# Patient Record
Sex: Female | Born: 1955 | Race: Black or African American | Hispanic: No | Marital: Married | Smoking: Never smoker
Health system: Southern US, Community
[De-identification: ages and names within clinical notes are randomized; demographics above are authoritative.]

## PROBLEM LIST (undated history)

## (undated) DIAGNOSIS — E785 Hyperlipidemia, unspecified: Secondary | ICD-10-CM

## (undated) DIAGNOSIS — J309 Allergic rhinitis, unspecified: Secondary | ICD-10-CM

## (undated) DIAGNOSIS — D219 Benign neoplasm of connective and other soft tissue, unspecified: Secondary | ICD-10-CM

## (undated) DIAGNOSIS — E079 Disorder of thyroid, unspecified: Secondary | ICD-10-CM

## (undated) DIAGNOSIS — N189 Chronic kidney disease, unspecified: Secondary | ICD-10-CM

## (undated) DIAGNOSIS — I10 Essential (primary) hypertension: Secondary | ICD-10-CM

## (undated) DIAGNOSIS — E039 Hypothyroidism, unspecified: Secondary | ICD-10-CM

## (undated) DIAGNOSIS — E559 Vitamin D deficiency, unspecified: Secondary | ICD-10-CM

## (undated) HISTORY — DX: Disorder of thyroid, unspecified: E07.9

## (undated) HISTORY — PX: LIPOMA EXCISION: SHX5283

## (undated) HISTORY — DX: Essential (primary) hypertension: I10

## (undated) HISTORY — PX: BREAST SURGERY: SHX581

## (undated) HISTORY — PX: OTHER SURGICAL HISTORY: SHX169

## (undated) HISTORY — PX: FINE NEEDLE ASPIRATION: SHX406

---

## 2002-04-04 HISTORY — PX: ABDOMINAL HYSTERECTOMY: SHX81

## 2004-02-05 ENCOUNTER — Ambulatory Visit: Payer: Self-pay | Admitting: Obstetrics and Gynecology

## 2004-04-26 ENCOUNTER — Ambulatory Visit: Payer: Self-pay

## 2005-05-03 ENCOUNTER — Ambulatory Visit: Payer: Self-pay | Admitting: Obstetrics and Gynecology

## 2005-10-05 ENCOUNTER — Emergency Department: Payer: Self-pay | Admitting: Emergency Medicine

## 2006-05-11 ENCOUNTER — Ambulatory Visit: Payer: Self-pay | Admitting: Obstetrics and Gynecology

## 2007-07-03 ENCOUNTER — Ambulatory Visit: Payer: Self-pay | Admitting: Obstetrics and Gynecology

## 2007-09-03 ENCOUNTER — Ambulatory Visit: Payer: Self-pay | Admitting: Gastroenterology

## 2008-07-03 ENCOUNTER — Ambulatory Visit: Payer: Self-pay | Admitting: Obstetrics and Gynecology

## 2009-07-07 ENCOUNTER — Ambulatory Visit: Payer: Self-pay | Admitting: Obstetrics and Gynecology

## 2010-08-05 ENCOUNTER — Ambulatory Visit: Payer: Self-pay | Admitting: Obstetrics and Gynecology

## 2011-04-05 HISTORY — PX: COLONOSCOPY: SHX174

## 2011-08-08 ENCOUNTER — Ambulatory Visit: Payer: Self-pay | Admitting: Obstetrics and Gynecology

## 2011-12-13 ENCOUNTER — Emergency Department: Payer: Self-pay | Admitting: Emergency Medicine

## 2012-06-01 ENCOUNTER — Ambulatory Visit: Payer: Self-pay | Admitting: Gastroenterology

## 2012-06-04 LAB — PATHOLOGY REPORT

## 2012-09-18 ENCOUNTER — Ambulatory Visit: Payer: Self-pay | Admitting: Obstetrics and Gynecology

## 2012-10-08 ENCOUNTER — Ambulatory Visit: Payer: Self-pay | Admitting: Obstetrics and Gynecology

## 2012-10-12 ENCOUNTER — Encounter: Payer: Self-pay | Admitting: *Deleted

## 2012-11-05 ENCOUNTER — Ambulatory Visit (INDEPENDENT_AMBULATORY_CARE_PROVIDER_SITE_OTHER): Payer: Managed Care, Other (non HMO) | Admitting: General Surgery

## 2012-11-05 ENCOUNTER — Other Ambulatory Visit: Payer: Self-pay

## 2012-11-05 ENCOUNTER — Encounter: Payer: Self-pay | Admitting: General Surgery

## 2012-11-05 VITALS — BP 118/68 | HR 76 | Resp 16 | Ht 66.0 in | Wt 282.0 lb

## 2012-11-05 DIAGNOSIS — R928 Other abnormal and inconclusive findings on diagnostic imaging of breast: Secondary | ICD-10-CM

## 2012-11-05 DIAGNOSIS — N63 Unspecified lump in unspecified breast: Secondary | ICD-10-CM

## 2012-11-05 NOTE — Progress Notes (Addendum)
Patient ID: Vanessa Gray, female   DOB: 1955/04/06, 57 y.o.   MRN: [id number]  Chief Complaint  Patient presents with  . Other    New Patient abdnormal mammogram    HPI Shelvy Heckert is a 57 y.o. female who presents for a breast evaluation. The most recent mammogram was done on 09/19/12 with a birad category 0. Additional views of the right breast and a right breast ultrasound was done on 10/08/12 @ [facility] with a category 4.  The patient does not perform self breast checks. She has a family history of breast cancer. She denies any previous breast surgeries. Last year she had some clear discharge from both breasts. She saw Dr. Logan Bores and  he decided to do slides which come back negative. No new problems with the breasts at this time.    HPI  Past Medical History  Diagnosis Date  . Thyroid disease   . Hypertension     Past Surgical History  Procedure Laterality Date  . Abdominal hysterectomy  2004  . Colonoscopy  2013    Family History  Problem Relation Age of Onset  . Cancer Mother 61    colon  . Cancer Maternal Aunt 70    cancer  . Cancer Maternal Aunt 60    breast  . Cancer Other     great maternal grandmother - breast    Social History History  Substance Use Topics  . Smoking status: Never Smoker   . Smokeless tobacco: Not on file  . Alcohol Use: No    No Known Allergies  Current Outpatient Prescriptions  Medication Sig Dispense Refill  . aspirin 81 MG tablet Take 81 mg by mouth daily.      Marland Kitchen atorvastatin (LIPITOR) 10 MG tablet Take 10 mg by mouth daily.      . Cyanocobalamin (VITAMIN B 12 PO) Take 500 Int'l Units by mouth daily.      . fish oil-omega-3 fatty acids 1000 MG capsule Take 1 g by mouth daily.      . hydrochlorothiazide (HYDRODIURIL) 25 MG tablet Take 25 mg by mouth daily.      Marland Kitchen levothyroxine (SYNTHROID, LEVOTHROID) 100 MCG tablet Take 100 mcg by mouth daily before breakfast.      . lisinopril (PRINIVIL,ZESTRIL) 20 MG tablet Take 20 mg by mouth  daily.      . potassium chloride SA (K-DUR,KLOR-CON) 20 MEQ tablet Take 20 mEq by mouth daily.      . Vitamin D, Ergocalciferol, (DRISDOL) 50000 UNITS CAPS capsule Take 50,000 Units by mouth every 7 (seven) days.       No current facility-administered medications for this visit.    Review of Systems Review of Systems  Blood pressure 118/68, pulse 76, resp. rate 16, height 5\' 6"  (1.676 m), weight 282 lb (127.914 kg).  Physical Exam Physical Exam  Constitutional: She is oriented to person, place, and time. She appears well-developed and well-nourished.  Neck: Trachea normal. No thyromegaly present.  Cardiovascular: Normal rate, regular rhythm and normal heart sounds.   No murmur heard. Pulmonary/Chest: Effort normal and breath sounds normal. Right breast exhibits no inverted nipple, no mass, no nipple discharge, no skin change and no tenderness. Left breast exhibits no inverted nipple, no mass, no nipple discharge, no skin change and no tenderness.  Right breast larger then left.   Lymphadenopathy:    She has no cervical adenopathy.    She has no axillary adenopathy.  Neurological: She is alert and oriented to person, place,  and time.  Skin: Skin is warm and dry.    Data Reviewed Screening mammogram dated 09/19/2012 suggested an asymmetric density in the right breast seen only on the CC view. Left breast is unremarkable. BI-RAD-0.  Focal spot compression views of the right breast dated 10/08/2012 showed findings primarily in the CC view. Partial effacement. Cluster of calcifications were associated in this area.  Sonographic evaluation of the breast showed an indeterminate nodule within the central portion of the breast with radiographic and sonographic characteristic which are of concern. BI-RAD-4. A second nodule appreciated in the 3:00 position was also listed as indeterminate. BI-RAD-4.  Ultrasound of the same date at the 3:00 position reported as 0.48 x 0.5 x 0.85 cm partially  lobulated area, and at the 4:00 position of 0.45 x 0.46 x 0.71 cm area.  Review of the mammograms from 2013-2014 showed increased prominence in the medial bright breast lesions. The density in the central portion of the breast is unlikely related to the lesion at 3 or 4:00 position based on its relatively superficial location on in comparison to the mammogram.  The right breast was evaluated and at the 3:00 position a 0.58 x 0.72 x 0.88 cm softly lobulated mass with edge affect and slight thickening at the inferior aspect was identified. No clear-cut acoustic shadowing identified. The patient was amenable to FNA sampling. A total of 10 cc of 0.5% Xylocaine with 0.25% Marcaine with 1-200,000 units of epinephrine was used through the procedure. The lesion at the 3:00 position was approached with a 22-gauge needle and near complete resolution obtained. Thin, can fluid was returned and slides times before prepared for cytology. No bleeding was noted.  Attention was turned to the 4-5:00 position of the breast. Both areas or 6 cm from the nipple. At the 4:00 position a 0.5 x 0.61 x 0.8 cm multilobulated area with some faint acoustic enhancement is noted. This was aspirated with a 22-gauge needle with complete resolution. Slides x4 were prepared for a similar volume of thin, tan fluid. The patient tolerated procedure well.    Assessment    Complex cyst right breast, resolved on aspiration.  Questionable density in the central portion of the breast.    Plan    The patient will be contacted when cytology reports are available. Assuming these are benign she'll be asked to return in 3 months for reevaluation. Core biopsy may be necessary in the future.  Will review her films with the attending radiologist regarding the central right breast lesion     The mammograms and ultrasound were reviewed with the attending radiologist this morning. Until density is unlikely related to the 2 lesions noted at the 3  and 4:00 position on ultrasound. It nearly completely effaces on compression views, and a 6 month followup exam is warranted.  Earline Mayotte 11/05/2012, 7:31 PM

## 2012-11-05 NOTE — Patient Instructions (Signed)
Follow up in 3 months

## 2012-11-09 LAB — FINE-NEEDLE ASPIRATION

## 2012-11-13 ENCOUNTER — Telehealth: Payer: Self-pay | Admitting: *Deleted

## 2012-11-13 NOTE — Telephone Encounter (Signed)
Notified patient as instructed, patient pleased. Discussed follow-up appointments for 6 months, patient agrees

## 2012-11-13 NOTE — Telephone Encounter (Signed)
Confirming she received her results and she had.

## 2012-11-13 NOTE — Telephone Encounter (Signed)
Message copied by Currie Paris on Tue Nov 13, 2012  4:39 PM ------      Message from: [place], [place]      Created: Mon Nov 12, 2012  9:31 PM       Marcia:Please notify the patient of the cytology report on both lesions aspirated last week are benign. The films have been reviewed with the radiologist.  Am going to arrange for a followup right mammogram in 6 months.            Michelle: Please arrange for a followup mammogram of the right breast with focal spot compression views in 6 months with an office visit to follow. Thank you                           ----- Message -----         From: [facility] Lab Results In Interface         Sent: 11/09/2012   5:44 AM           To: Earline Mayotte, MD                   ------

## 2012-11-13 NOTE — Telephone Encounter (Signed)
Message copied by Currie Paris on Tue Nov 13, 2012  9:06 AM ------      Message from: [place], [place]      Created: Mon Nov 12, 2012  9:31 PM       Marcia:Please notify the patient of the cytology report on both lesions aspirated last week are benign. The films have been reviewed with the radiologist.  Am going to arrange for a followup right mammogram in 6 months.            Michelle: Please arrange for a followup mammogram of the right breast with focal spot compression views in 6 months with an office visit to follow. Thank you                           ----- Message -----         From: [facility] Lab Results In Interface         Sent: 11/09/2012   5:44 AM           To: Earline Mayotte, MD                   ------

## 2013-02-06 ENCOUNTER — Ambulatory Visit: Payer: Managed Care, Other (non HMO) | Admitting: General Surgery

## 2013-04-12 ENCOUNTER — Ambulatory Visit: Payer: Self-pay | Admitting: General Surgery

## 2013-04-15 ENCOUNTER — Encounter: Payer: Self-pay | Admitting: General Surgery

## 2013-04-24 ENCOUNTER — Encounter: Payer: Self-pay | Admitting: General Surgery

## 2013-04-24 ENCOUNTER — Ambulatory Visit (INDEPENDENT_AMBULATORY_CARE_PROVIDER_SITE_OTHER): Payer: Managed Care, Other (non HMO) | Admitting: General Surgery

## 2013-04-24 VITALS — BP 132/66 | HR 66 | Resp 14 | Ht 65.5 in | Wt 280.0 lb

## 2013-04-24 DIAGNOSIS — Z1231 Encounter for screening mammogram for malignant neoplasm of breast: Secondary | ICD-10-CM

## 2013-04-24 NOTE — Progress Notes (Signed)
Patient ID: Vanessa Gray, female   DOB: November 11, 1955, 58 y.o.   MRN: [id number]  Chief Complaint  Patient presents with  . Follow-up    mammogram    HPI Vanessa Gray is a 58 y.o. female who presents for a breast evaluation. The most recent mammogram was done on 04/12/13.  Patient does perform regular self breast checks and gets regular mammograms done.  No new breast issues.  HPI  Past Medical History  Diagnosis Date  . Thyroid disease   . Hypertension     Past Surgical History  Procedure Laterality Date  . Abdominal hysterectomy  2004  . Colonoscopy  2013  . Breast biopsy Right 11-05-12    FNA /SCANT CELLULARITY/.APOCRINE METAPLASIA    Family History  Problem Relation Age of Onset  . Cancer Mother 47    colon  . Cancer Maternal Aunt 70    cancer  . Cancer Maternal Aunt 60    breast  . Cancer Other     great maternal grandmother - breast    Social History History  Substance Use Topics  . Smoking status: Never Smoker   . Smokeless tobacco: Never Used  . Alcohol Use: No    No Known Allergies  Current Outpatient Prescriptions  Medication Sig Dispense Refill  . aspirin 81 MG tablet Take 81 mg by mouth daily.      Marland Kitchen atorvastatin (LIPITOR) 10 MG tablet Take 10 mg by mouth daily.      . Cyanocobalamin (VITAMIN B 12 PO) Take 500 Int'l Units by mouth daily.      . fish oil-omega-3 fatty acids 1000 MG capsule Take 1 g by mouth daily.      . hydrochlorothiazide (HYDRODIURIL) 25 MG tablet Take 25 mg by mouth daily.      Marland Kitchen levothyroxine (SYNTHROID, LEVOTHROID) 100 MCG tablet Take 100 mcg by mouth daily before breakfast.      . lisinopril (PRINIVIL,ZESTRIL) 20 MG tablet Take 20 mg by mouth daily.      . potassium chloride SA (K-DUR,KLOR-CON) 20 MEQ tablet Take 20 mEq by mouth daily.      . Vitamin D, Ergocalciferol, (DRISDOL) 50000 UNITS CAPS capsule Take 50,000 Units by mouth every 7 (seven) days.       No current facility-administered medications for this visit.     Review of Systems Review of Systems  Constitutional: Negative.   Respiratory: Negative.   Cardiovascular: Negative.     Blood pressure 132/66, pulse 66, resp. rate 14, height 5' 5.5" (1.664 m), weight 280 lb (127.007 kg).  Physical Exam Physical Exam  Constitutional: She is oriented to person, place, and time. She appears well-developed and well-nourished.  Neck: Neck supple.  Cardiovascular: Normal rate, regular rhythm and normal heart sounds.   Pulmonary/Chest: Effort normal and breath sounds normal. Right breast exhibits no inverted nipple, no mass, no nipple discharge, no skin change and no tenderness. Left breast exhibits no inverted nipple, no mass, no nipple discharge, no skin change and no tenderness.  Lymphadenopathy:    She has no cervical adenopathy.    She has no axillary adenopathy.  Neurological: She is alert and oriented to person, place, and time.  Skin: Skin is warm and dry.    Data Reviewed Right breast mammogram dated 04/12/2013 showed resolution of the previous identified nodular density in the central portion of the breast. BI-RAD-1.  Assessment    Benign breast exam to    Plan    The patient should resume annual screening  mammograms under the care of her primary care provider in summer 2015.       Robert Bellow 04/25/2013, 5:44 PM

## 2013-04-24 NOTE — Patient Instructions (Addendum)
Continue self breast exams. Call office for any new breast issues or concerns. 

## 2013-04-25 DIAGNOSIS — Z1231 Encounter for screening mammogram for malignant neoplasm of breast: Secondary | ICD-10-CM | POA: Insufficient documentation

## 2013-09-23 ENCOUNTER — Ambulatory Visit: Payer: Self-pay | Admitting: Obstetrics and Gynecology

## 2014-01-11 IMAGING — MG MM ADDITIONAL VIEWS AT NO CHARGE
2 series · 7 of 7 positions shown · non-contrast
Comparison: none

REASON FOR EXAM: av rt asymmetric density
COMMENTS:

PROCEDURE:     MAM - MAM DIG ADDVIEWS RT SCR  - October 08, 2012  [DATE]
RESULT:

[R ML · right · 4 of 4 slices shown]
[im 1/4]
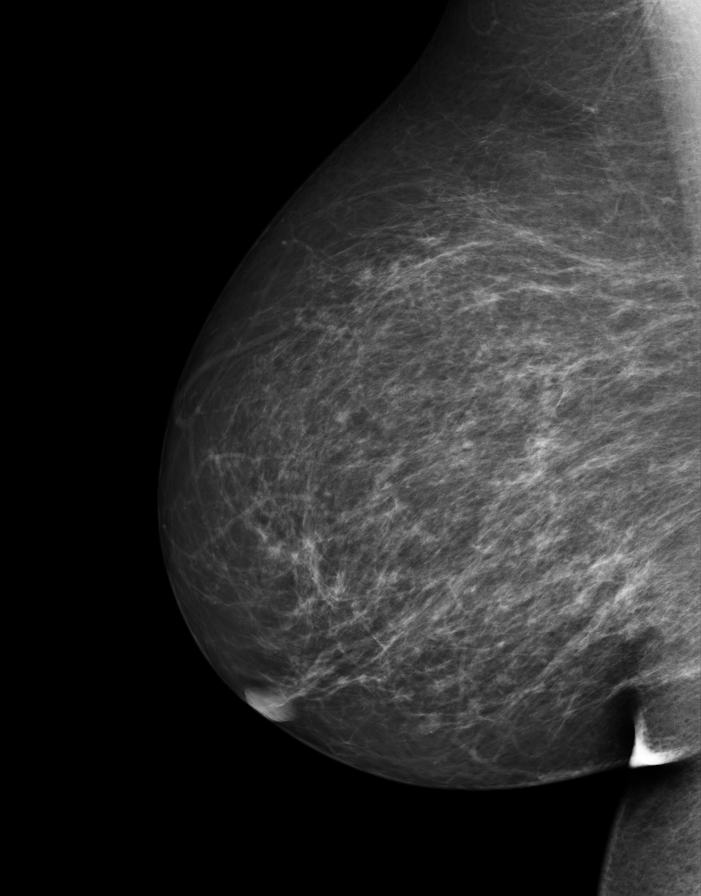
[im 2/4]
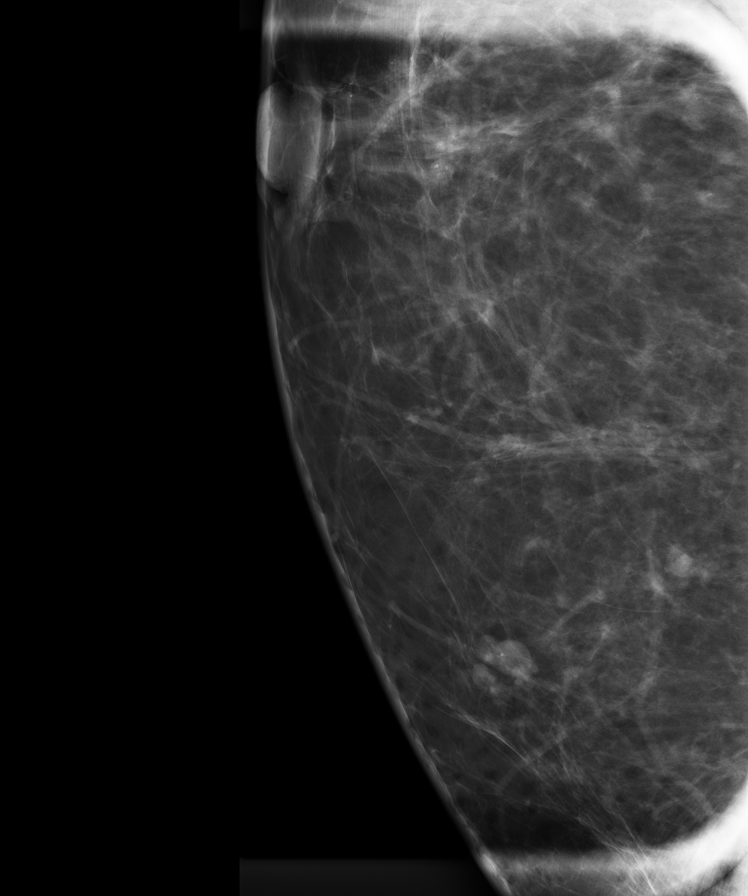
[im 3/4]
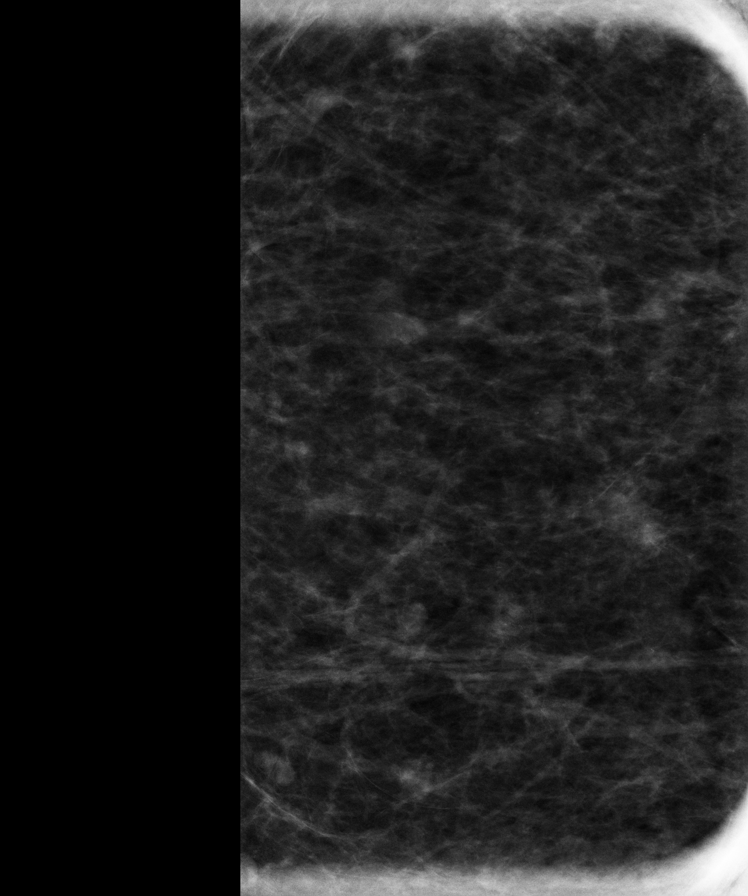
[im 4/4]
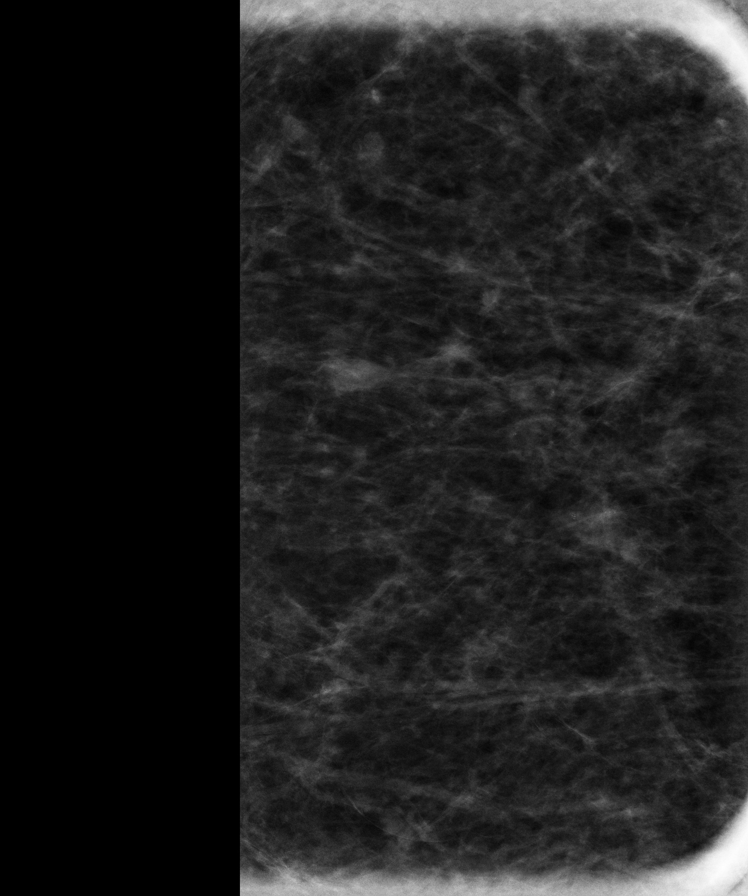

[R CC · right · 3 of 3 slices shown]
[im 1/3]
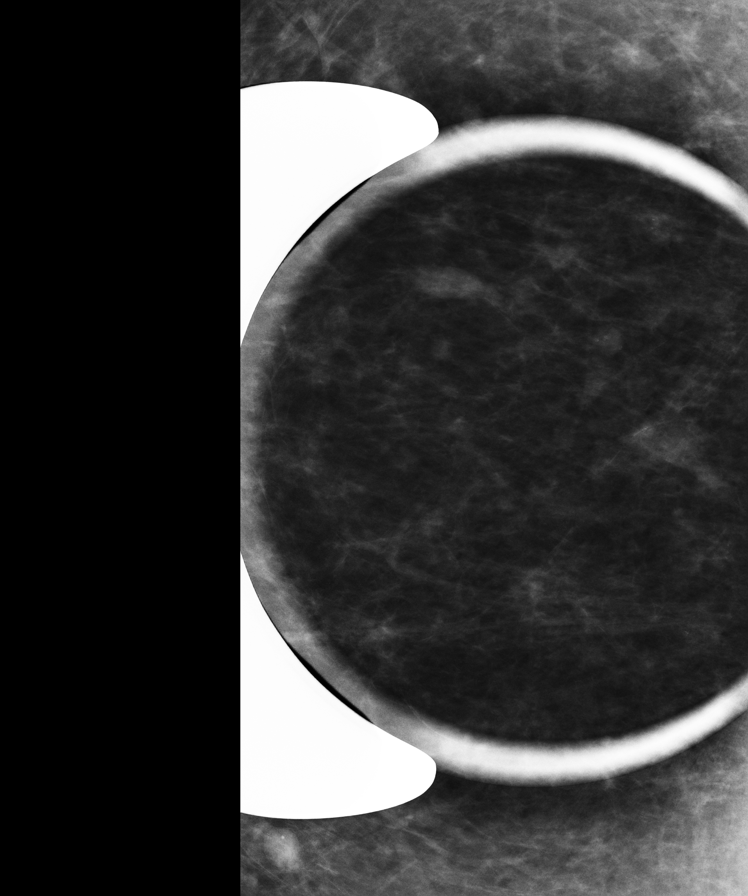
[im 2/3]
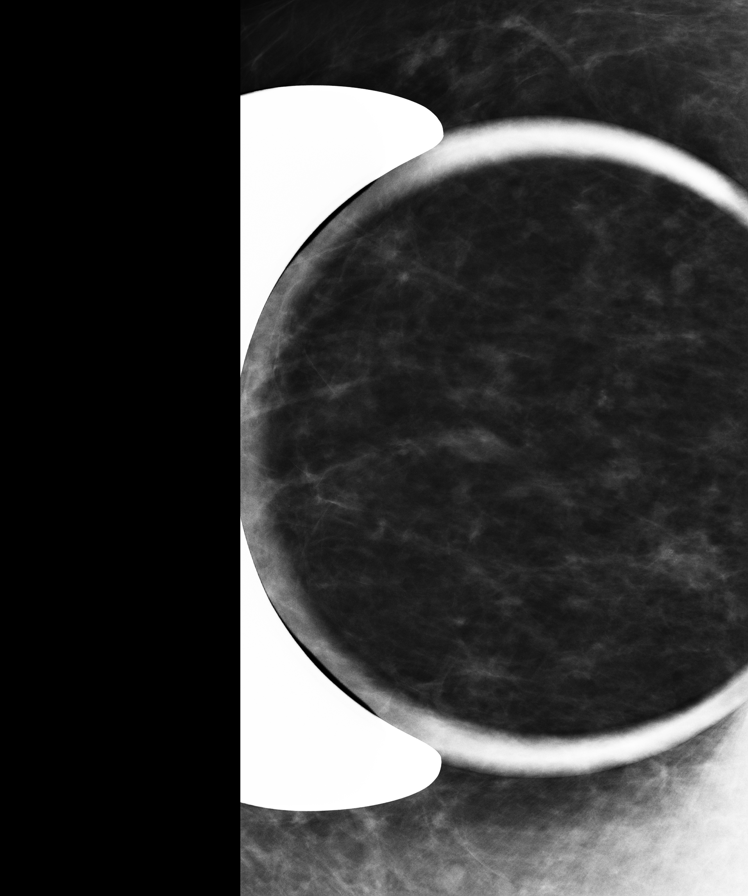
[im 3/3]
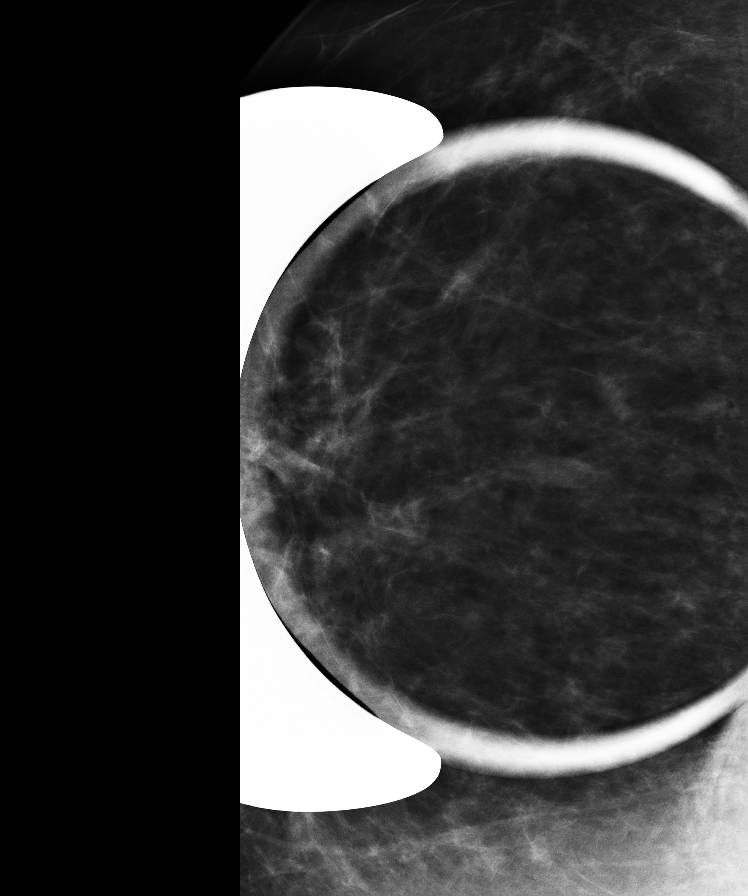

[7 of 7 positions shown; findings below may reference images not displayed]

FINDINGS: The area centrally within the right breast was further evaluated
with magnification compression imaging. This finding is appreciated
primarily on the CC view and persists with compression. There is a component
of mild effacement. Small foci of tightly clustered calcifications are
associated with this finding. Sonographic evaluation of this region
demonstrates an irregularly bordered hypoechoic nodule without vascularity.
There does appear to be a component of acoustic shadowing. The radiographic
and sonographic findings are indeterminate to concerning. The second nodule
of interest more medially within the right breast also persists with
compression. This area demonstrates borders which are primarily smoothly
marginated though partially obscured. This area appears to correlate with
the finding at the 3 o'clock position on ultrasound with hypoechoic to
anechoic architecture and smoothly marginated borders. Punctate areas of
benign-appearing calcifications are identified within this nodule on the
radiographic and sonographic findings. A smaller adjacent nodular focus is
also appreciated in this region with similar characteristics.
IMPRESSION: 1. Indeterminate nodule within the central portion of the breast with
radiographic and sonographic characteristics, which are concerning. Surgical
consultation recommended.

BI-RADS: Category 4 - Suspicious Abnormality.

2. A second nodule in a more lateral position at approximately the 3 o'clock
position demonstrating sonographic and radiographic characteristics, which
are indeterminate and surgical consultation recommended.

BI-RADS: Category 4 - Suspicious Abnormality.

A NEGATIVE MAMMOGRAM REPORT DOES NOT PRECLUDE BIOPSY OR OTHER EVALUATION OF
A CLINICALLY PALPABLE OR OTHERWISE SUSPICIOUS MASS OR LESION. BREAST CANCER
MAY NOT BE DETECTED BY MAMMOGRAPHY IN UP TO 10% OF CASES.

## 2014-01-11 IMAGING — US ULTRASOUND RIGHT BREAST
1 series · 14 of 25 positions shown · non-contrast
Comparison: none

REASON FOR EXAM: av rt asymmetric density
COMMENTS:

PROCEDURE:     US  - US BREAST RIGHT  - October 08, 2012 [DATE]
RESULT:

[Series 1: ultrasound right breast · 0.14mm/px · 14 of 30 slices shown]
[im 1/30]
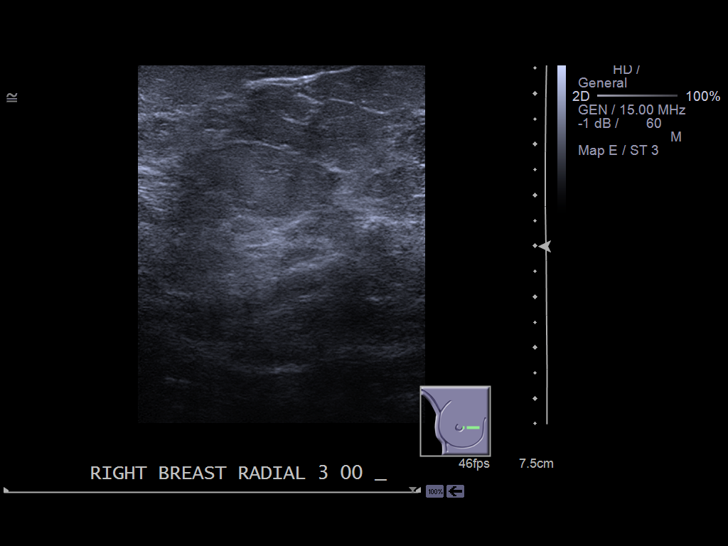
[im 3/30]
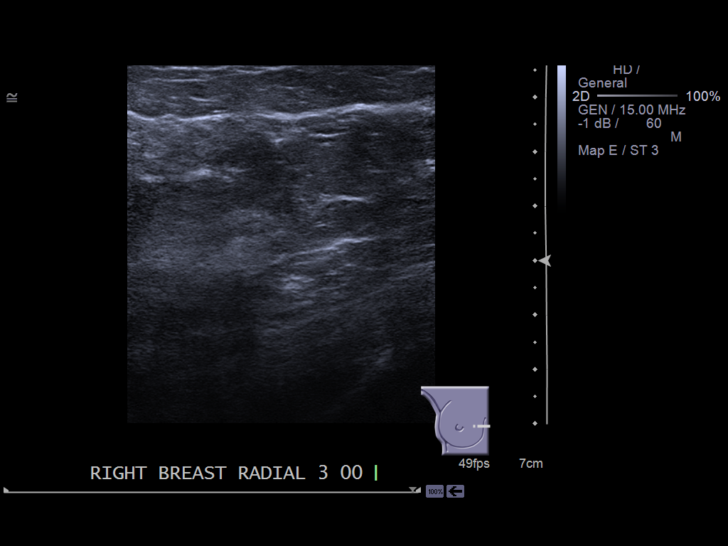
[im 5/30]
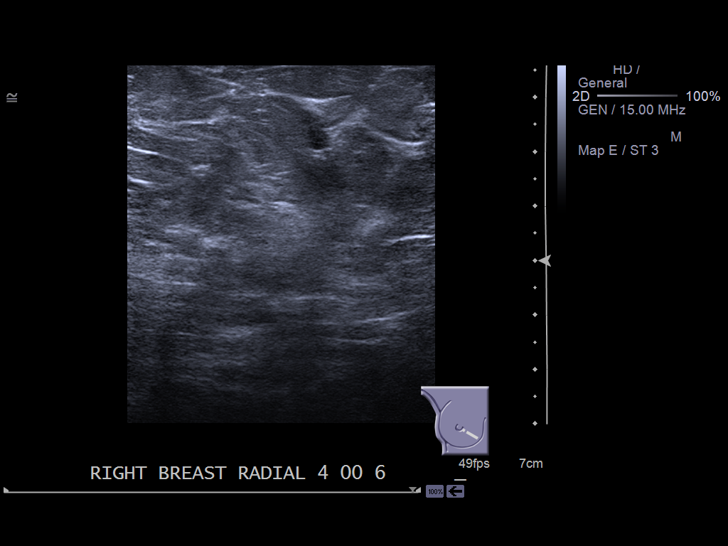
[im 8/30]
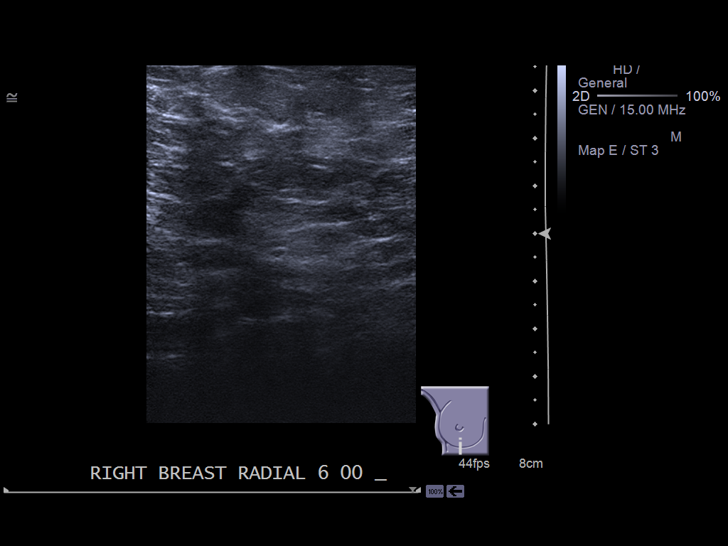
[im 10/30]
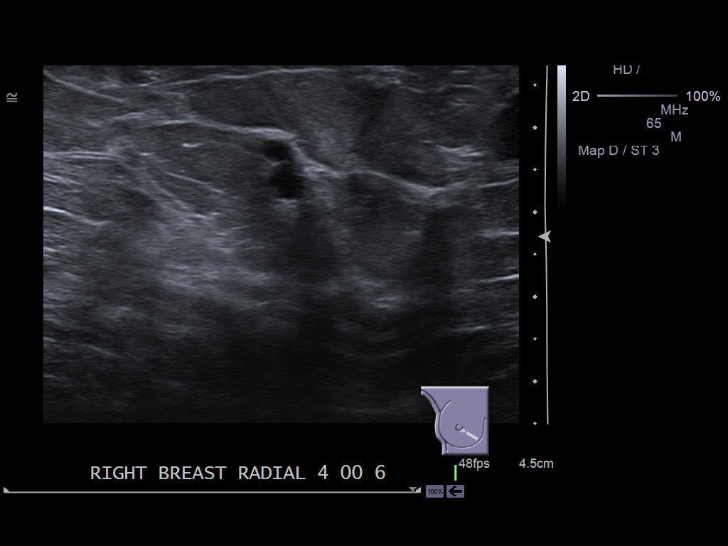
[im 11/30]
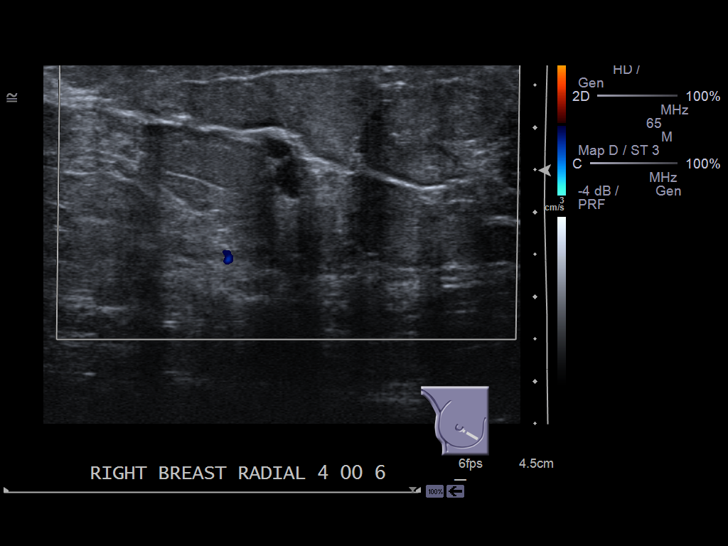
[im 14/30]
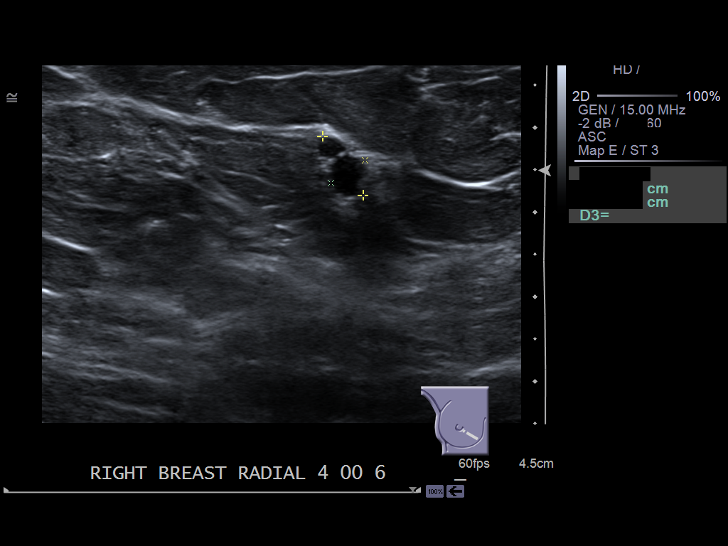
[im 16/30]
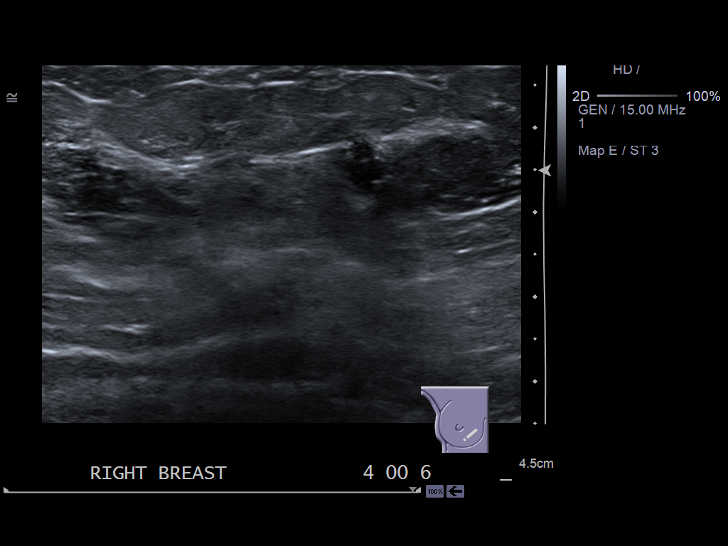
[im 19/30]
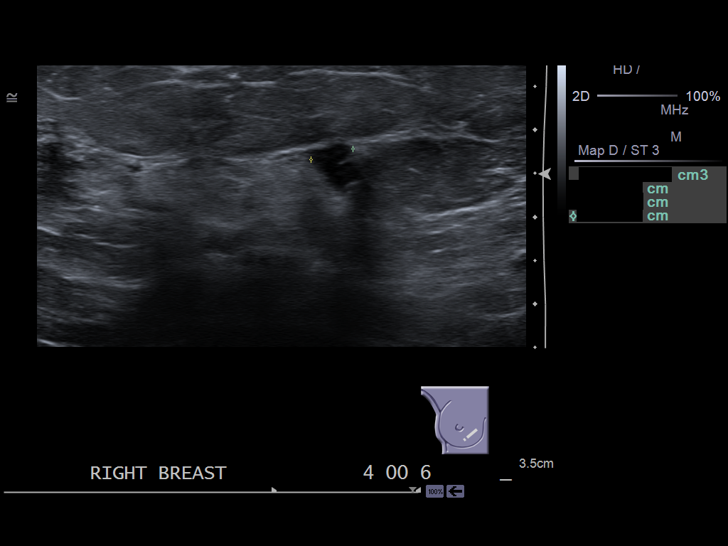
[im 20/30]
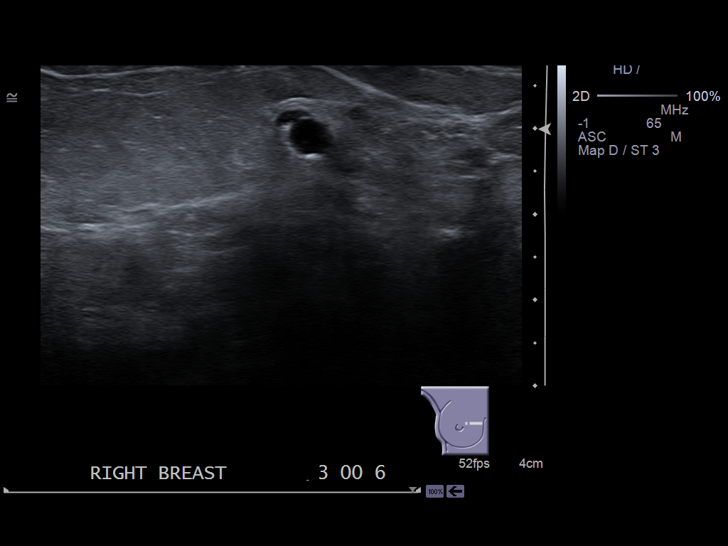
[im 22/30]
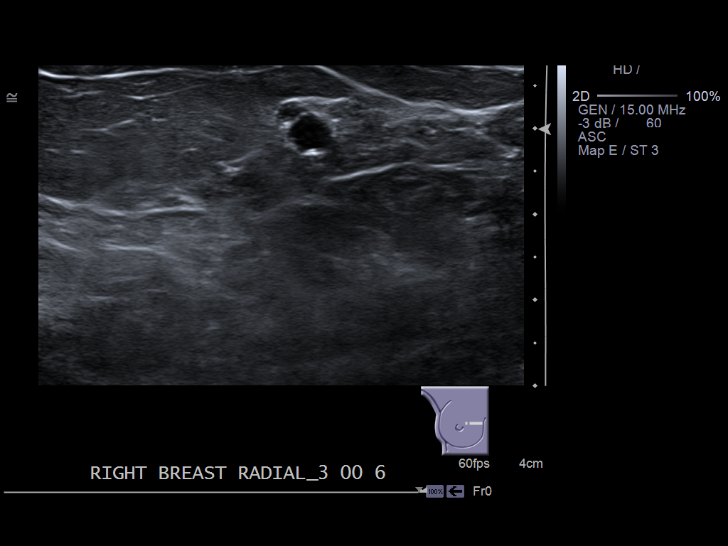
[im 25/30]
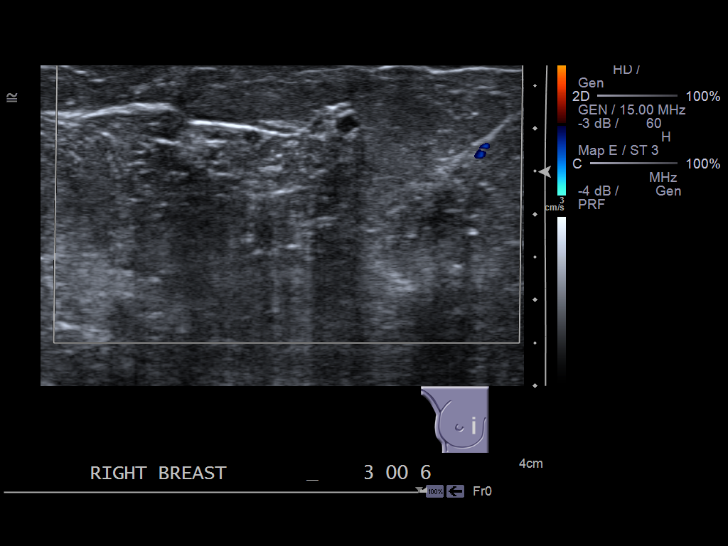
[im 27/30]
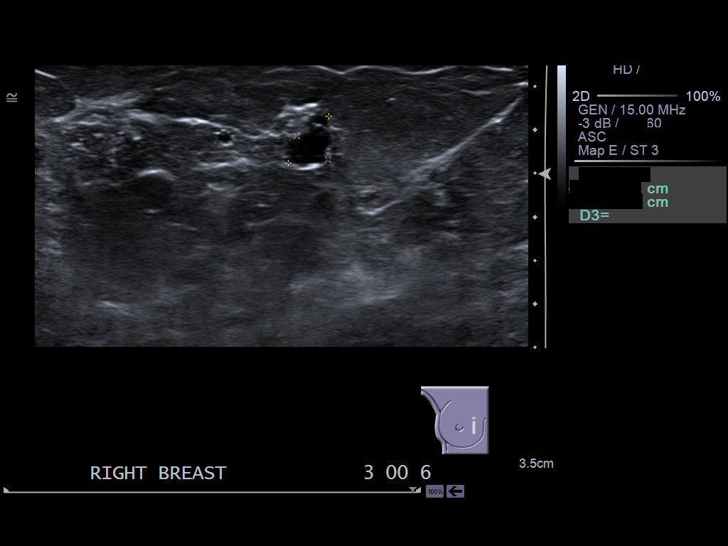
[im 30/30]
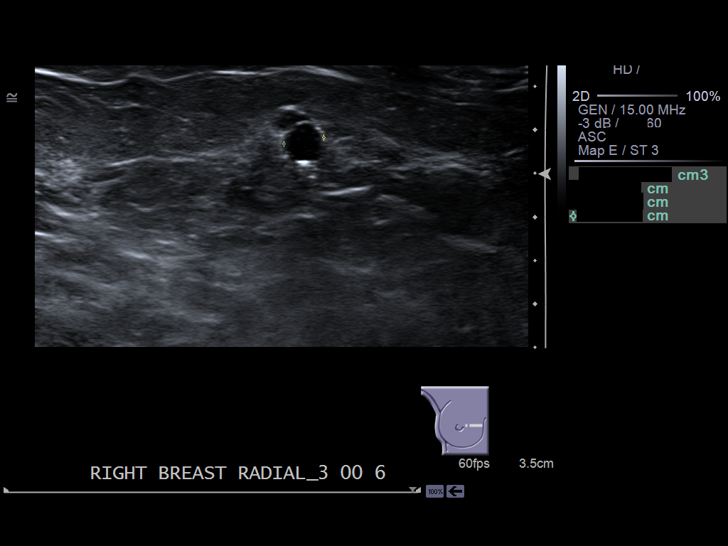

[14 of 25 positions shown; findings below may reference images not displayed]

FINDINGS: Evaluation of the right breast demonstrates an oblong shaped
hypoechoic nodule at the 3 o'clock position approximately 6 cm from the
nipple. A punctate area of calcification is identified within the nodule.
The borders are partially lobulated. This area measures 0.85 x 0.48 x
cm. The sonographic characteristics are indeterminate and further evaluation
with surgical consultation is recommended. A second area is identified at
the 4 o'clock position, which demonstrates a hypoechoic to anechoic
architecture and more rounded smoothly marginated borders. This has a
multifocal appearance and measures 0.71 x 0.45 x 0.46 cm. There is a
component of echogenicity associated with this finding. This finding too is
indeterminate and surgical consultation recommended. Note, these findings
are avascular.
IMPRESSION: 1. Indeterminate findings at the 3 o'clock and 4 o'clock positions of the
breast as described above. Surgical consultation recommended.

## 2014-02-03 ENCOUNTER — Encounter: Payer: Self-pay | Admitting: General Surgery

## 2014-09-18 ENCOUNTER — Other Ambulatory Visit: Payer: Self-pay | Admitting: Obstetrics and Gynecology

## 2014-09-18 DIAGNOSIS — Z1231 Encounter for screening mammogram for malignant neoplasm of breast: Secondary | ICD-10-CM

## 2014-09-25 ENCOUNTER — Ambulatory Visit
Admission: RE | Admit: 2014-09-25 | Discharge: 2014-09-25 | Disposition: A | Discharge status: Home / Self Care | Payer: 59 | Source: Ambulatory Visit | Attending: Obstetrics and Gynecology | Admitting: Obstetrics and Gynecology

## 2014-09-25 DIAGNOSIS — Z1231 Encounter for screening mammogram for malignant neoplasm of breast: Secondary | ICD-10-CM | POA: Diagnosis not present

## 2015-10-15 ENCOUNTER — Other Ambulatory Visit: Payer: Self-pay | Admitting: Obstetrics and Gynecology

## 2015-10-15 DIAGNOSIS — Z1231 Encounter for screening mammogram for malignant neoplasm of breast: Secondary | ICD-10-CM

## 2015-11-03 ENCOUNTER — Ambulatory Visit
Admission: RE | Admit: 2015-11-03 | Discharge: 2015-11-03 | Disposition: A | Discharge status: Home / Self Care | Payer: 59 | Source: Ambulatory Visit | Attending: Obstetrics and Gynecology | Admitting: Obstetrics and Gynecology

## 2015-11-03 DIAGNOSIS — Z1231 Encounter for screening mammogram for malignant neoplasm of breast: Secondary | ICD-10-CM

## 2015-11-06 ENCOUNTER — Other Ambulatory Visit: Payer: Self-pay | Admitting: Obstetrics and Gynecology

## 2015-11-06 DIAGNOSIS — N6310 Unspecified lump in the right breast, unspecified quadrant: Secondary | ICD-10-CM

## 2015-11-12 ENCOUNTER — Ambulatory Visit
Admission: RE | Admit: 2015-11-12 | Discharge: 2015-11-12 | Disposition: A | Discharge status: Home / Self Care | Payer: 59 | Source: Ambulatory Visit | Attending: Obstetrics and Gynecology | Admitting: Obstetrics and Gynecology

## 2015-11-12 DIAGNOSIS — N63 Unspecified lump in breast: Secondary | ICD-10-CM | POA: Diagnosis present

## 2015-11-12 DIAGNOSIS — N6310 Unspecified lump in the right breast, unspecified quadrant: Secondary | ICD-10-CM

## 2015-11-12 DIAGNOSIS — N6001 Solitary cyst of right breast: Secondary | ICD-10-CM | POA: Insufficient documentation

## 2016-10-20 ENCOUNTER — Other Ambulatory Visit: Payer: Self-pay | Admitting: Internal Medicine

## 2016-10-20 DIAGNOSIS — Z1231 Encounter for screening mammogram for malignant neoplasm of breast: Secondary | ICD-10-CM

## 2016-11-08 ENCOUNTER — Ambulatory Visit
Admission: RE | Admit: 2016-11-08 | Discharge: 2016-11-08 | Disposition: A | Discharge status: Home / Self Care | Payer: Commercial Managed Care - PPO | Source: Ambulatory Visit | Attending: Internal Medicine | Admitting: Internal Medicine

## 2016-11-08 DIAGNOSIS — Z1231 Encounter for screening mammogram for malignant neoplasm of breast: Secondary | ICD-10-CM | POA: Insufficient documentation

## 2017-11-01 ENCOUNTER — Other Ambulatory Visit: Payer: Self-pay | Admitting: Internal Medicine

## 2017-11-01 DIAGNOSIS — Z1231 Encounter for screening mammogram for malignant neoplasm of breast: Secondary | ICD-10-CM

## 2017-11-28 ENCOUNTER — Ambulatory Visit
Admission: RE | Admit: 2017-11-28 | Discharge: 2017-11-28 | Disposition: A | Discharge status: Home / Self Care | Payer: Commercial Managed Care - PPO | Source: Ambulatory Visit | Attending: Internal Medicine | Admitting: Internal Medicine

## 2017-11-28 DIAGNOSIS — Z1231 Encounter for screening mammogram for malignant neoplasm of breast: Secondary | ICD-10-CM | POA: Insufficient documentation

## 2018-06-07 ENCOUNTER — Encounter: Payer: Self-pay | Admitting: Emergency Medicine

## 2018-06-08 ENCOUNTER — Ambulatory Visit: Payer: Commercial Managed Care - PPO | Admitting: Certified Registered"

## 2018-06-08 ENCOUNTER — Encounter: Payer: Self-pay | Admitting: *Deleted

## 2018-06-08 ENCOUNTER — Encounter
Admission: RE | Disposition: A | Discharge status: Home / Self Care | Payer: Self-pay | Source: Home / Self Care | Attending: Gastroenterology

## 2018-06-08 ENCOUNTER — Ambulatory Visit
Admission: RE | Admit: 2018-06-08 | Discharge: 2018-06-08 | Disposition: A | Discharge status: Home / Self Care | Payer: Commercial Managed Care - PPO | Attending: Gastroenterology | Admitting: Gastroenterology

## 2018-06-08 DIAGNOSIS — J45909 Unspecified asthma, uncomplicated: Secondary | ICD-10-CM | POA: Diagnosis not present

## 2018-06-08 DIAGNOSIS — Z6841 Body Mass Index (BMI) 40.0 and over, adult: Secondary | ICD-10-CM | POA: Diagnosis not present

## 2018-06-08 DIAGNOSIS — Z1211 Encounter for screening for malignant neoplasm of colon: Secondary | ICD-10-CM | POA: Diagnosis present

## 2018-06-08 DIAGNOSIS — D123 Benign neoplasm of transverse colon: Secondary | ICD-10-CM | POA: Insufficient documentation

## 2018-06-08 DIAGNOSIS — Z7982 Long term (current) use of aspirin: Secondary | ICD-10-CM | POA: Diagnosis not present

## 2018-06-08 DIAGNOSIS — Z86010 Personal history of colonic polyps: Secondary | ICD-10-CM | POA: Diagnosis not present

## 2018-06-08 DIAGNOSIS — E559 Vitamin D deficiency, unspecified: Secondary | ICD-10-CM | POA: Diagnosis not present

## 2018-06-08 DIAGNOSIS — D175 Benign lipomatous neoplasm of intra-abdominal organs: Secondary | ICD-10-CM | POA: Insufficient documentation

## 2018-06-08 DIAGNOSIS — R0683 Snoring: Secondary | ICD-10-CM | POA: Diagnosis not present

## 2018-06-08 DIAGNOSIS — Z79899 Other long term (current) drug therapy: Secondary | ICD-10-CM | POA: Diagnosis not present

## 2018-06-08 DIAGNOSIS — I10 Essential (primary) hypertension: Secondary | ICD-10-CM | POA: Insufficient documentation

## 2018-06-08 DIAGNOSIS — E039 Hypothyroidism, unspecified: Secondary | ICD-10-CM | POA: Insufficient documentation

## 2018-06-08 HISTORY — PX: COLONOSCOPY WITH PROPOFOL: SHX5780

## 2018-06-08 HISTORY — DX: Allergic rhinitis, unspecified: J30.9

## 2018-06-08 HISTORY — DX: Hypothyroidism, unspecified: E03.9

## 2018-06-08 HISTORY — DX: Vitamin D deficiency, unspecified: E55.9

## 2018-06-08 HISTORY — DX: Benign neoplasm of connective and other soft tissue, unspecified: D21.9

## 2018-06-08 SURGERY — COLONOSCOPY WITH PROPOFOL
Anesthesia: General

## 2018-06-08 MED ORDER — PROPOFOL 500 MG/50ML IV EMUL
INTRAVENOUS | Status: AC
  Filled 2018-06-08: qty 50

## 2018-06-08 MED ORDER — PROPOFOL 10 MG/ML IV BOLUS
INTRAVENOUS | Status: AC
  Filled 2018-06-08: qty 20

## 2018-06-08 MED ORDER — PROPOFOL 500 MG/50ML IV EMUL
INTRAVENOUS | Status: DC | PRN
  Administered 2018-06-08: 150 ug/kg/min via INTRAVENOUS

## 2018-06-08 MED ORDER — SODIUM CHLORIDE 0.9 % IV SOLN: INTRAVENOUS | Status: DC

## 2018-06-08 MED ORDER — PROPOFOL 10 MG/ML IV BOLUS
INTRAVENOUS | Status: DC | PRN
  Administered 2018-06-08: 50 mg via INTRAVENOUS

## 2018-06-08 MED ORDER — SODIUM CHLORIDE 0.9 % IV SOLN
INTRAVENOUS | Status: DC
  Administered 2018-06-08: 1000 mL via INTRAVENOUS

## 2018-06-08 MED ORDER — SODIUM CHLORIDE 0.9 % IV SOLN
INTRAVENOUS | Status: DC
  Administered 2018-06-08: 15:00:00 via INTRAVENOUS

## 2018-06-08 NOTE — Anesthesia Postprocedure Evaluation (Signed)
Anesthesia Post Note  Patient: Vanessa Gray  Procedure(s) Performed: COLONOSCOPY WITH PROPOFOL (N/A )  Patient location during evaluation: Endoscopy Anesthesia Type: General Level of consciousness: awake and alert Pain management: pain level controlled Vital Signs Assessment: post-procedure vital signs reviewed and stable Respiratory status: spontaneous breathing, nonlabored ventilation, respiratory function stable and patient connected to nasal cannula oxygen Cardiovascular status: blood pressure returned to baseline and stable Postop Assessment: no apparent nausea or vomiting Anesthetic complications: no     Last Vitals:  Vitals:   06/08/18 1540 06/08/18 1550  BP: 115/63 127/63  Pulse: 76 66  Resp: 19 16  Temp:    SpO2: 98% 100%    Last Pain:  Vitals:   06/08/18 1550  TempSrc:   PainSc: 0-No pain                 Martha Clan

## 2018-06-08 NOTE — Anesthesia Preprocedure Evaluation (Addendum)
Anesthesia Evaluation  Patient identified by MRN, date of birth, ID band Patient awake    Reviewed: Allergy & Precautions, H&P , NPO status , Patient's Chart, lab work & pertinent test results  Airway Mallampati: III  TM Distance: >3 FB     Dental  (+) Teeth Intact   Pulmonary asthma , sleep apnea (suspected; snores at night although says this has improved since she lost a significant amount of weight) ,           Cardiovascular hypertension,      Neuro/Psych negative neurological ROS  negative psych ROS   GI/Hepatic negative GI ROS, Neg liver ROS,   Endo/Other  Hypothyroidism Morbid obesity  Renal/GU negative Renal ROS  negative genitourinary   Musculoskeletal   Abdominal   Peds  Hematology negative hematology ROS (+)   Anesthesia Other Findings Past Medical History: No date: Allergic rhinitis No date: Fibroid tumor     Comment:  Uterine No date: Hypertension No date: Hypothyroidism No date: Thyroid disease No date: Vitamin D deficiency  Past Surgical History: 2004: ABDOMINAL HYSTERECTOMY No date: BREAST SURGERY 2013: COLONOSCOPY  BMI    Body Mass Index:  40.97 kg/m      Reproductive/Obstetrics negative OB ROS                            Anesthesia Physical Anesthesia Plan  ASA: III  Anesthesia Plan: General   Post-op Pain Management:    Induction:   PONV Risk Score and Plan: Propofol infusion and TIVA  Airway Management Planned: Natural Airway and Simple Face Mask  Additional Equipment:   Intra-op Plan:   Post-operative Plan:   Informed Consent: I have reviewed the patients History and Physical, chart, labs and discussed the procedure including the risks, benefits and alternatives for the proposed anesthesia with the patient or authorized representative who has indicated his/her understanding and acceptance.     Dental Advisory Given  Plan Discussed with:  Anesthesiologist and CRNA  Anesthesia Plan Comments:        Anesthesia Quick Evaluation

## 2018-06-08 NOTE — Transfer of Care (Signed)
Immediate Anesthesia Transfer of Care Note  Patient: Vanessa Gray  Procedure(s) Performed: COLONOSCOPY WITH PROPOFOL (N/A )  Patient Location: Endoscopy Unit  Anesthesia Type:General  Level of Consciousness: awake, alert  and oriented  Airway & Oxygen Therapy: Patient Spontanous Breathing and Patient connected to nasal cannula oxygen  Post-op Assessment: Report given to RN and Post -op Vital signs reviewed and stable  Post vital signs: Reviewed and stable  Last Vitals:  Vitals Value Taken Time  BP    Temp    Pulse    Resp    SpO2      Last Pain:  Vitals:   06/08/18 1259  TempSrc: Tympanic  PainSc: 0-No pain      Patients Stated Pain Goal: 0 (66/06/30 1601)  Complications: No apparent anesthesia complications

## 2018-06-08 NOTE — H&P (Signed)
Outpatient short stay form Pre-procedure 06/08/2018 2:44 PM Lollie Sails MD   Primary Physician: Dr Tracie Harrier  Reason for visit: Colonoscopy  History of present illness: Patient is a 63 year old female presenting today for colonoscopy in regards to her personal history of adenomatous colon polyps.  She also has a primary relative, mother, who has a personal history of colon cancer.  Patient's last colonoscopy was 06/01/2012.  She tolerated her prep well.  She takes no aspirin or blood thinning agent with the exception of a 81 mg aspirin that is been held.    Current Facility-Administered Medications:  .  0.9 %  sodium chloride infusion, , Intravenous, Continuous, Loistine Simas U, MD .  0.9 %  sodium chloride infusion, , Intravenous, Continuous, Lollie Sails, MD .  0.9 %  sodium chloride infusion, , Intravenous, Continuous, Lollie Sails, MD, Last Rate: 20 mL/hr at 06/08/18 1337, 1,000 mL at 06/08/18 1337  Medications Prior to Admission  Medication Sig Dispense Refill Last Dose  . aspirin 81 MG tablet Take 81 mg by mouth daily.   Past Week at Unknown time  . atorvastatin (LIPITOR) 10 MG tablet Take 10 mg by mouth daily.   Past Week at Unknown time  . Cyanocobalamin (VITAMIN B 12 PO) Take 500 Int'l Units by mouth daily.   Past Week at Unknown time  . fish oil-omega-3 fatty acids 1000 MG capsule Take 1 g by mouth daily.   Past Month at Unknown time  . hydrochlorothiazide (HYDRODIURIL) 25 MG tablet Take 25 mg by mouth daily.   06/07/2018 at Unknown time  . levothyroxine (SYNTHROID, LEVOTHROID) 100 MCG tablet Take 100 mcg by mouth daily before breakfast.   06/07/2018 at Unknown time  . lisinopril (PRINIVIL,ZESTRIL) 20 MG tablet Take 20 mg by mouth daily.   06/08/2018 at Unknown time  . potassium chloride SA (K-DUR,KLOR-CON) 20 MEQ tablet Take 20 mEq by mouth daily.   06/07/2018 at Unknown time  . Vitamin D, Ergocalciferol, (DRISDOL) 50000 UNITS CAPS capsule Take 50,000 Units by  mouth every 7 (seven) days.   Past Week at Unknown time  . Albuterol Sulfate 108 (90 Base) MCG/ACT AEPB Inhale into the lungs.   Not Taking at Unknown time     No Known Allergies   Past Medical History:  Diagnosis Date  . Allergic rhinitis   . Fibroid tumor    Uterine  . Hypertension   . Hypothyroidism   . Thyroid disease   . Vitamin D deficiency     Review of systems:      Physical Exam    Heart and lungs: Regular rate and rhythm without rub or gallop, lungs are bilaterally clear.    HEENT: Normocephalic atraumatic eyes are anicteric    Other:    Pertinant exam for procedure: Soft nontender nondistended bowel sounds positive normoactive.    Planned proceedures: Anoscopy and indicated procedures. I have discussed the risks benefits and complications of procedures to include not limited to bleeding, infection, perforation and the risk of sedation and the patient wishes to proceed.    Lollie Sails, MD Gastroenterology 06/08/2018  2:44 PM

## 2018-06-08 NOTE — Anesthesia Post-op Follow-up Note (Signed)
Anesthesia QCDR form completed.        

## 2018-06-08 NOTE — Op Note (Signed)
[facility] Gastroenterology Patient Name: Vanessa Gray Procedure Date: 06/08/2018 2:29 PM MRN: [id number] Account #: [id number] Date of Birth: Dec 27, 1955 Admit Type: Outpatient Age: 63 Room: [facility] ENDO ROOM 3 Gender: Female Note Status: Finalized Procedure:            Colonoscopy Indications:          Personal history of colonic polyps Providers:            Lollie Sails, MD Medicines:            Monitored Anesthesia Care Complications:        No immediate complications. Procedure:            Pre-Anesthesia Assessment:                       - ASA Grade Assessment: III - A patient with severe                        systemic disease.                       After obtaining informed consent, the colonoscope was                        passed under direct vision. Throughout the procedure,                        the patient's blood pressure, pulse, and oxygen                        saturations were monitored continuously. The                        Colonoscope was introduced through the anus and                        advanced to the the cecum, identified by appendiceal                        orifice and ileocecal valve. The colonoscopy was                        performed with moderate difficulty due to a tortuous                        colon. Successful completion of the procedure was aided                        by changing the patient to a prone position. The                        patient tolerated the procedure well. The quality of                        the bowel preparation was good. Findings:      A 5 mm polyp was found in the hepatic flexure. The polyp was sessile.       The polyp was removed with a cold snare. Resection and retrieval were       complete.      The retroflexed view of the distal rectum and anal verge  was normal and       showed no anal or rectal abnormalities.      The exam was otherwise without abnormality.      The digital rectal exam  was normal.      There was a small lipoma, positive pillow sign      in the ascending colon. Impression:           - One 5 mm polyp at the hepatic flexure, removed with a                        cold snare. Resected and retrieved.                       - The distal rectum and anal verge are normal on                        retroflexion view.                       - The examination was otherwise normal. Recommendation:       - Discharge patient to home.                       - Soft diet today, then advance as tolerated to advance                        diet as tolerated. Lollie Sails, MD 06/08/2018 3:29:34 PM This report has been signed electronically. Number of Addenda: 0 Note Initiated On: 06/08/2018 2:29 PM Scope Withdrawal Time: 0 hours 7 minutes 18 seconds  Total Procedure Duration: 0 hours 22 minutes 43 seconds       [facility]

## 2018-06-09 ENCOUNTER — Encounter: Payer: Self-pay | Admitting: Gastroenterology

## 2018-06-12 LAB — SURGICAL PATHOLOGY

## 2018-11-09 ENCOUNTER — Other Ambulatory Visit: Payer: Self-pay | Admitting: Obstetrics and Gynecology

## 2018-11-09 ENCOUNTER — Other Ambulatory Visit: Payer: Self-pay | Admitting: Internal Medicine

## 2018-12-25 ENCOUNTER — Other Ambulatory Visit: Payer: Self-pay | Admitting: Internal Medicine

## 2018-12-25 DIAGNOSIS — Z1231 Encounter for screening mammogram for malignant neoplasm of breast: Secondary | ICD-10-CM

## 2019-01-15 ENCOUNTER — Encounter (INDEPENDENT_AMBULATORY_CARE_PROVIDER_SITE_OTHER): Payer: Self-pay

## 2019-01-15 ENCOUNTER — Other Ambulatory Visit: Payer: Self-pay

## 2019-01-15 ENCOUNTER — Ambulatory Visit
Admission: RE | Admit: 2019-01-15 | Discharge: 2019-01-15 | Disposition: A | Discharge status: Home / Self Care | Payer: Commercial Managed Care - PPO | Source: Ambulatory Visit | Attending: Internal Medicine | Admitting: Internal Medicine

## 2019-01-15 DIAGNOSIS — Z1231 Encounter for screening mammogram for malignant neoplasm of breast: Secondary | ICD-10-CM | POA: Diagnosis present

## 2019-06-03 ENCOUNTER — Emergency Department: Payer: Medicare Other

## 2019-06-03 ENCOUNTER — Other Ambulatory Visit: Payer: Self-pay

## 2019-06-03 ENCOUNTER — Encounter: Payer: Self-pay | Admitting: Emergency Medicine

## 2019-06-03 ENCOUNTER — Emergency Department
Admission: EM | Admit: 2019-06-03 | Discharge: 2019-06-03 | Disposition: A | Discharge status: Home / Self Care | Payer: Medicare Other | Attending: Emergency Medicine | Admitting: Emergency Medicine

## 2019-06-03 DIAGNOSIS — E039 Hypothyroidism, unspecified: Secondary | ICD-10-CM | POA: Insufficient documentation

## 2019-06-03 DIAGNOSIS — R1032 Left lower quadrant pain: Secondary | ICD-10-CM | POA: Diagnosis present

## 2019-06-03 DIAGNOSIS — I10 Essential (primary) hypertension: Secondary | ICD-10-CM | POA: Diagnosis not present

## 2019-06-03 DIAGNOSIS — Z7982 Long term (current) use of aspirin: Secondary | ICD-10-CM | POA: Insufficient documentation

## 2019-06-03 DIAGNOSIS — Z79899 Other long term (current) drug therapy: Secondary | ICD-10-CM | POA: Diagnosis not present

## 2019-06-03 DIAGNOSIS — R1010 Upper abdominal pain, unspecified: Secondary | ICD-10-CM

## 2019-06-03 DIAGNOSIS — K529 Noninfective gastroenteritis and colitis, unspecified: Secondary | ICD-10-CM | POA: Diagnosis not present

## 2019-06-03 LAB — CBC
HCT: 38.4 % (ref 36.0–46.0)
Hemoglobin: 12.5 g/dL (ref 12.0–15.0)
MCH: 32.6 pg (ref 26.0–34.0)
MCHC: 32.6 g/dL (ref 30.0–36.0)
MCV: 100.3 fL — ABNORMAL HIGH (ref 80.0–100.0)
Platelets: 182 10*3/uL (ref 150–400)
RBC: 3.83 MIL/uL — ABNORMAL LOW (ref 3.87–5.11)
RDW: 12.9 % (ref 11.5–15.5)
WBC: 18.2 10*3/uL — ABNORMAL HIGH (ref 4.0–10.5)
nRBC: 0 % (ref 0.0–0.2)

## 2019-06-03 LAB — COMPREHENSIVE METABOLIC PANEL
ALT: 28 U/L (ref 0–44)
AST: 25 U/L (ref 15–41)
Albumin: 4.3 g/dL (ref 3.5–5.0)
Alkaline Phosphatase: 68 U/L (ref 38–126)
Anion gap: 13 (ref 5–15)
BUN: 19 mg/dL (ref 8–23)
CO2: 21 mmol/L — ABNORMAL LOW (ref 22–32)
Calcium: 9.3 mg/dL (ref 8.9–10.3)
Chloride: 108 mmol/L (ref 98–111)
Creatinine, Ser: 1.01 mg/dL — ABNORMAL HIGH (ref 0.44–1.00)
GFR calc Af Amer: >60 mL/min (ref >60)
GFR calc non Af Amer: 58 mL/min — ABNORMAL LOW (ref >60)
Glucose, Bld: 100 mg/dL — ABNORMAL HIGH (ref 70–99)
Potassium: 3.9 mmol/L (ref 3.5–5.1)
Sodium: 142 mmol/L (ref 135–145)
Total Bilirubin: 1 mg/dL (ref 0.3–1.2)
Total Protein: 7.7 g/dL (ref 6.5–8.1)

## 2019-06-03 LAB — URINALYSIS, COMPLETE (UACMP) WITH MICROSCOPIC
Bacteria, UA: NONE SEEN
Bilirubin Urine: NEGATIVE
Glucose, UA: NEGATIVE mg/dL
Hgb urine dipstick: NEGATIVE
Ketones, ur: 5 mg/dL — AB
Leukocytes,Ua: NEGATIVE
Nitrite: NEGATIVE
Protein, ur: NEGATIVE mg/dL
Specific Gravity, Urine: >1.046 — ABNORMAL HIGH (ref 1.005–1.030)
pH: 5 (ref 5.0–8.0)

## 2019-06-03 LAB — LIPASE, BLOOD: Lipase: 23 U/L (ref 11–51)

## 2019-06-03 MED ORDER — IOHEXOL 350 MG/ML SOLN
100.0000 mL | Freq: Once | INTRAVENOUS | Status: AC | PRN
  Administered 2019-06-03: 100 mL via INTRAVENOUS
  Filled 2019-06-03: qty 100

## 2019-06-03 MED ORDER — METRONIDAZOLE 500 MG PO TABS: 500.0000 mg | ORAL_TABLET | Freq: Two times a day (BID) | ORAL | 0 refills | Status: AC

## 2019-06-03 MED ORDER — CIPROFLOXACIN HCL 500 MG PO TABS: 500.0000 mg | ORAL_TABLET | Freq: Two times a day (BID) | ORAL | 0 refills | Status: AC

## 2019-06-03 MED ORDER — ONDANSETRON 4 MG PO TBDP: 4.0000 mg | ORAL_TABLET | Freq: Three times a day (TID) | ORAL | 0 refills | Status: AC | PRN

## 2019-06-03 NOTE — ED Triage Notes (Signed)
Patient presents to the ED with left lower and central lower abdominal pain that woke her up around 1:30am this morning.  Patient reports vomiting x 7 and diarrhea x 5 this morning.  Patient states now she is no longer nauseous but abdominal pain continues.  Patient states she recently began taking iron pills on Saturday.

## 2019-06-03 NOTE — ED Provider Notes (Signed)
[facility] Emergency Department Provider Note   ____________________________________________    I have reviewed the triage vital signs and the nursing notes.   HISTORY  Chief Complaint Diarrhea, Emesis, and Abdominal Pain     HPI Vanessa Gray is a 64 y.o. female who presents with complaints of abdominal pain, vomiting and diarrhea.  Patient reports that vomiting is improved but she continues to have pain in her left lower abdomen primarily which she describes as crampy.  She continues to have diarrhea.  She denies fevers or chills.  No sick contacts.  No recent travel.  Does not take anything for this.  No history of the same.   Past Medical History:  Diagnosis Date  . Allergic rhinitis   . Fibroid tumor    Uterine  . Hypertension   . Hypothyroidism   . Thyroid disease   . Vitamin D deficiency     Patient Active Problem List   Diagnosis Date Noted  . Other screening mammogram 04/25/2013  . Lump or mass in breast 11/05/2012  . Abnormal mammogram 11/05/2012    Past Surgical History:  Procedure Laterality Date  . ABDOMINAL HYSTERECTOMY  2004  . BREAST SURGERY    . COLONOSCOPY  2013  . COLONOSCOPY WITH PROPOFOL N/A 06/08/2018   Procedure: COLONOSCOPY WITH PROPOFOL;  Surgeon: Lollie Sails, MD;  Location: [facility] ENDOSCOPY;  Service: Endoscopy;  Laterality: N/A;    Prior to Admission medications   Medication Sig Start Date End Date Taking? Authorizing Provider  Albuterol Sulfate 108 (90 Base) MCG/ACT AEPB Inhale into the lungs.    [provider]  aspirin 81 MG tablet Take 81 mg by mouth daily.    [provider]  atorvastatin (LIPITOR) 10 MG tablet Take 10 mg by mouth daily.    [provider]  ciprofloxacin (CIPRO) 500 MG tablet Take 1 tablet (500 mg total) by mouth 2 (two) times daily for 10 days. 06/03/19 06/13/19  Lavonia Drafts, MD  Cyanocobalamin (VITAMIN B 12 PO) Take 500 Int'l Units by mouth daily.     [provider]  fish oil-omega-3 fatty acids 1000 MG capsule Take 1 g by mouth daily.    [provider]  hydrochlorothiazide (HYDRODIURIL) 25 MG tablet Take 25 mg by mouth daily.    [provider]  levothyroxine (SYNTHROID, LEVOTHROID) 100 MCG tablet Take 100 mcg by mouth daily before breakfast.    [provider]  lisinopril (PRINIVIL,ZESTRIL) 20 MG tablet Take 20 mg by mouth daily.    [provider]  metroNIDAZOLE (FLAGYL) 500 MG tablet Take 1 tablet (500 mg total) by mouth 2 (two) times daily after a meal. 06/03/19   Lavonia Drafts, MD  ondansetron (ZOFRAN ODT) 4 MG disintegrating tablet Take 1 tablet (4 mg total) by mouth every 8 (eight) hours as needed. 06/03/19   Lavonia Drafts, MD  potassium chloride SA (K-DUR,KLOR-CON) 20 MEQ tablet Take 20 mEq by mouth daily.    [provider]  Vitamin D, Ergocalciferol, (DRISDOL) 50000 UNITS CAPS capsule Take 50,000 Units by mouth every 7 (seven) days.    [provider]     Allergies Patient has no known allergies.  Family History  Problem Relation Age of Onset  . Cancer Mother 41       colon  . Breast cancer Mother 70  . Cancer Maternal Aunt 70       cancer  . Breast cancer Maternal Aunt 60  . Cancer Maternal  Aunt 60       breast  . Breast cancer Maternal Aunt 70  . Cancer Other        great maternal grandmother - breast    Social History Social History   Tobacco Use  . Smoking status: Never Smoker  . Smokeless tobacco: Never Used  Substance Use Topics  . Alcohol use: No  . Drug use: No    Review of Systems  Constitutional: No fever/chills Eyes: No visual changes.  ENT: No sore throat. Cardiovascular: Denies chest pain. Respiratory: Denies shortness of breath. Gastrointestinal: As above Genitourinary: Negative for dysuria. Musculoskeletal: Negative for back pain. Skin: Negative for rash. Neurological: Negative for  headaches   ____________________________________________   PHYSICAL EXAM:  VITAL SIGNS: ED Triage Vitals [06/03/19 1224]  Enc Vitals Group     BP (!) 166/71     Pulse Rate 71     Resp 18     Temp 98.9 F (37.2 C)     Temp Source Oral     SpO2 100 %     Weight 119.3 kg (263 lb)     Height 1.664 m (5' 5.5")     Head Circumference      Peak Flow      Pain Score 10     Pain Loc      Pain Edu?      Excl. in [place]?     Constitutional: Alert and oriented.   Nose: No congestion/rhinnorhea. Mouth/Throat: Mucous membranes are moist.   Neck:  Painless ROM Cardiovascular: Normal rate, regular rhythm. Grossly normal heart sounds.  Good peripheral circulation. Respiratory: Normal respiratory effort.  No retractions. Lungs CTAB. Gastrointestinal: Mild tenderness in left lower quadrant.  No distention.  No CVA tenderness.  Musculoskeletal: No lower extremity tenderness nor edema.  Warm and well perfused Neurologic:  Normal speech and language. No gross focal neurologic deficits are appreciated.  Skin:  Skin is warm, dry and intact. No rash noted. Psychiatric: Mood and affect are normal. Speech and behavior are normal.  ____________________________________________   LABS (all labs ordered are listed, but only abnormal results are displayed)  Labs Reviewed  COMPREHENSIVE METABOLIC PANEL - Abnormal; Notable for the following components:      Result Value   CO2 21 (*)    Glucose, Bld 100 (*)    Creatinine, Ser 1.01 (*)    GFR calc non Af Amer 58 (*)    All other components within normal limits  CBC - Abnormal; Notable for the following components:   WBC 18.2 (*)    RBC 3.83 (*)    MCV 100.3 (*)    All other components within normal limits  URINALYSIS, COMPLETE (UACMP) WITH MICROSCOPIC - Abnormal; Notable for the following components:   Color, Urine YELLOW (*)    APPearance CLEAR (*)    Specific Gravity, Urine >1.046 (*)    Ketones, ur 5 (*)    All other components within  normal limits  LIPASE, BLOOD   ____________________________________________  EKG   ____________________________________________  RADIOLOGY  CT scan most consistent with colitis, possible stranding around gallbladder Ultrasound right upper quadrant inconclusive, patient does not have any tenderness in this area not consistent with cholecystitis ____________________________________________   PROCEDURES  Procedure(s) performed: No  Procedures   Critical Care performed: No ____________________________________________   INITIAL IMPRESSION / ASSESSMENT AND PLAN / ED COURSE  Pertinent labs & imaging results that were available during my care of the patient were reviewed by me and considered  in my medical decision making (see chart for details).  Patient presents with left lower quadrant abdominal pain, nausea vomiting diarrhea.  Differential includes colitis, gastroenteritis, diverticulitis.  Given her tenderness in the left lower quadrant will obtain CT abdomen pelvis, lab work significant for an elevated white blood cell count of 18.2.  Otherwise lab work is reassuring, urinalysis is unremarkable.  CT scan is most consistent with colitis however some stranding possible around gallbladder.  Ultrasound is inconclusive, she has no tenderness in the right upper quadrant, not consistent with cholecystitis.  I believe she is suffering from colitis, we will treat with antibiotics, have her follow-up as an outpatient, return precautions discussed   ____________________________________________   FINAL CLINICAL IMPRESSION(S) / ED DIAGNOSES  Final diagnoses:  Upper abdominal pain  Colitis        Note:  This document was prepared using Dragon voice recognition software and may include unintentional dictation errors.   Lavonia Drafts, MD 06/03/19 2008

## 2020-01-24 ENCOUNTER — Other Ambulatory Visit: Payer: Self-pay | Admitting: Internal Medicine

## 2020-01-24 DIAGNOSIS — Z1231 Encounter for screening mammogram for malignant neoplasm of breast: Secondary | ICD-10-CM

## 2020-02-12 ENCOUNTER — Other Ambulatory Visit: Payer: Self-pay

## 2020-02-12 ENCOUNTER — Encounter (INDEPENDENT_AMBULATORY_CARE_PROVIDER_SITE_OTHER): Payer: Self-pay

## 2020-02-12 ENCOUNTER — Ambulatory Visit
Admission: RE | Admit: 2020-02-12 | Discharge: 2020-02-12 | Disposition: A | Discharge status: Home / Self Care | Payer: Medicare Other | Source: Ambulatory Visit | Attending: Internal Medicine | Admitting: Internal Medicine

## 2020-02-12 DIAGNOSIS — Z1231 Encounter for screening mammogram for malignant neoplasm of breast: Secondary | ICD-10-CM | POA: Insufficient documentation

## 2020-12-15 ENCOUNTER — Other Ambulatory Visit: Payer: Self-pay | Admitting: Internal Medicine

## 2020-12-15 DIAGNOSIS — Z1231 Encounter for screening mammogram for malignant neoplasm of breast: Secondary | ICD-10-CM

## 2021-02-16 ENCOUNTER — Inpatient Hospital Stay: Admission: RE | Admit: 2021-02-16 | Payer: Medicare Other | Source: Ambulatory Visit

## 2021-04-22 ENCOUNTER — Ambulatory Visit
Admission: RE | Admit: 2021-04-22 | Discharge: 2021-04-22 | Disposition: A | Discharge status: Home / Self Care | Payer: Medicare HMO | Source: Ambulatory Visit | Attending: Internal Medicine | Admitting: Internal Medicine

## 2021-04-22 ENCOUNTER — Other Ambulatory Visit: Payer: Self-pay

## 2021-04-22 DIAGNOSIS — Z1231 Encounter for screening mammogram for malignant neoplasm of breast: Secondary | ICD-10-CM | POA: Insufficient documentation

## 2021-07-20 ENCOUNTER — Other Ambulatory Visit: Payer: Self-pay | Admitting: General Surgery

## 2021-07-21 LAB — SURGICAL PATHOLOGY

## 2022-03-16 ENCOUNTER — Other Ambulatory Visit: Payer: Self-pay | Admitting: Internal Medicine

## 2022-03-16 DIAGNOSIS — Z1231 Encounter for screening mammogram for malignant neoplasm of breast: Secondary | ICD-10-CM

## 2022-04-25 ENCOUNTER — Ambulatory Visit
Admission: RE | Admit: 2022-04-25 | Discharge: 2022-04-25 | Disposition: A | Discharge status: Home / Self Care | Payer: Medicare HMO | Source: Ambulatory Visit | Attending: Internal Medicine | Admitting: Internal Medicine

## 2022-04-25 DIAGNOSIS — Z1231 Encounter for screening mammogram for malignant neoplasm of breast: Secondary | ICD-10-CM | POA: Insufficient documentation

## 2022-07-26 IMAGING — MG MM DIGITAL SCREENING BILAT W/ TOMO AND CAD
8 of 14 series · 8 of 40 positions shown · non-contrast
Comparison: Previous exam(s).

CLINICAL DATA: Screening.

EXAM:
DIGITAL SCREENING BILATERAL MAMMOGRAM WITH TOMOSYNTHESIS AND CAD
TECHNIQUE: Bilateral screening digital craniocaudal and mediolateral oblique
mammograms were obtained. Bilateral screening digital breast
tomosynthesis was performed. The images were evaluated with
computer-aided detection.

[L CC synth-2D (1 of 3)]
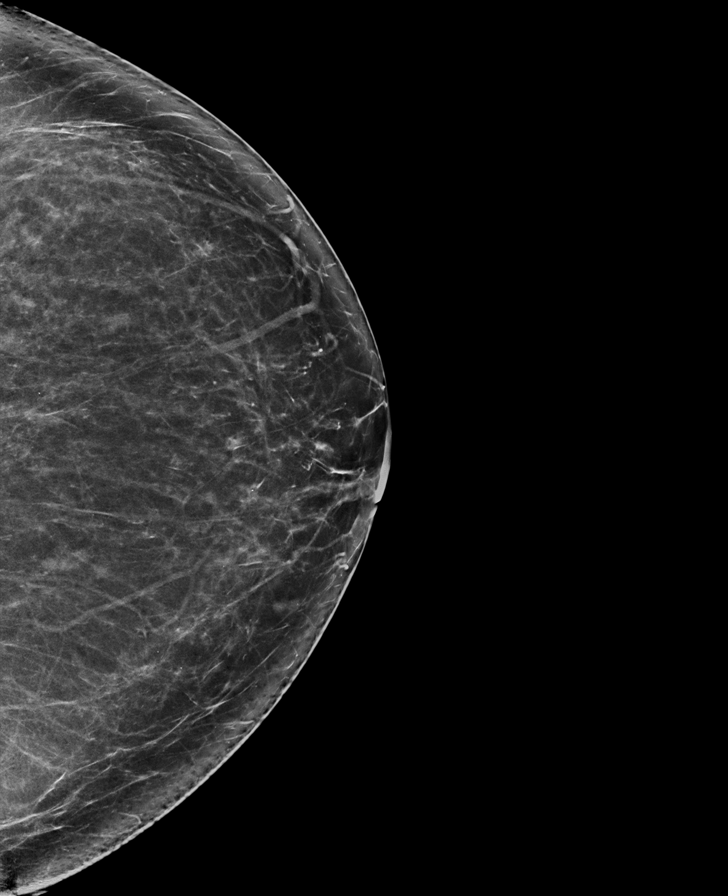

[L CC synth-2D (2 of 3)]
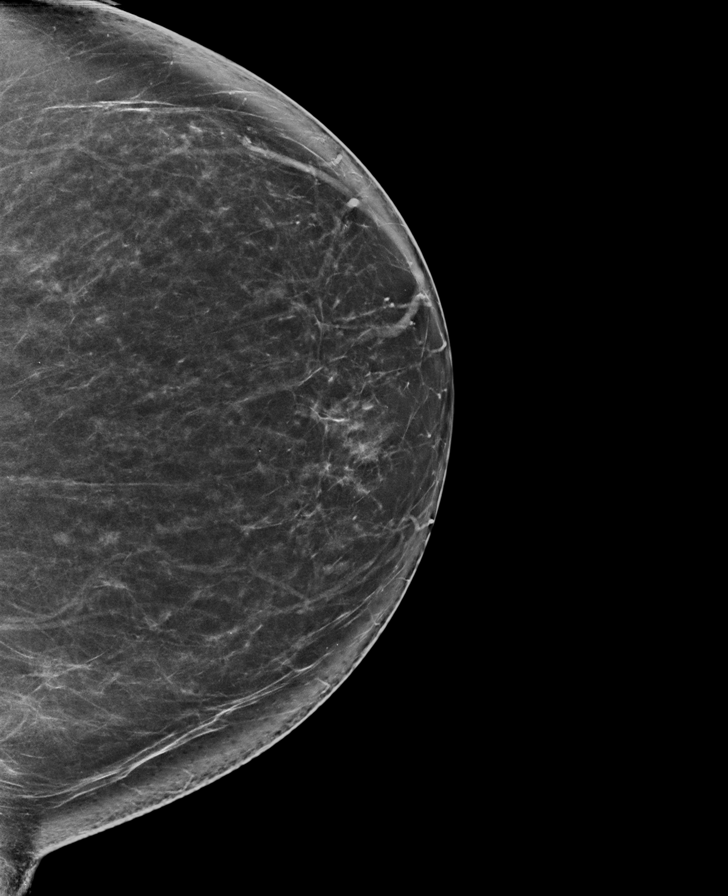

[L MLO synth-2D]
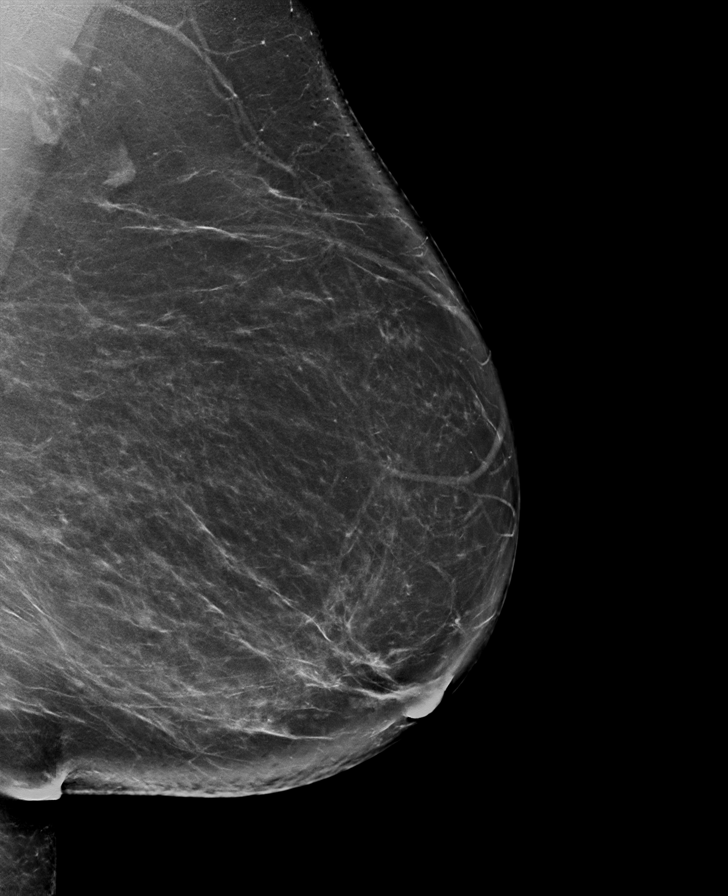

[R MLO synth-2D]
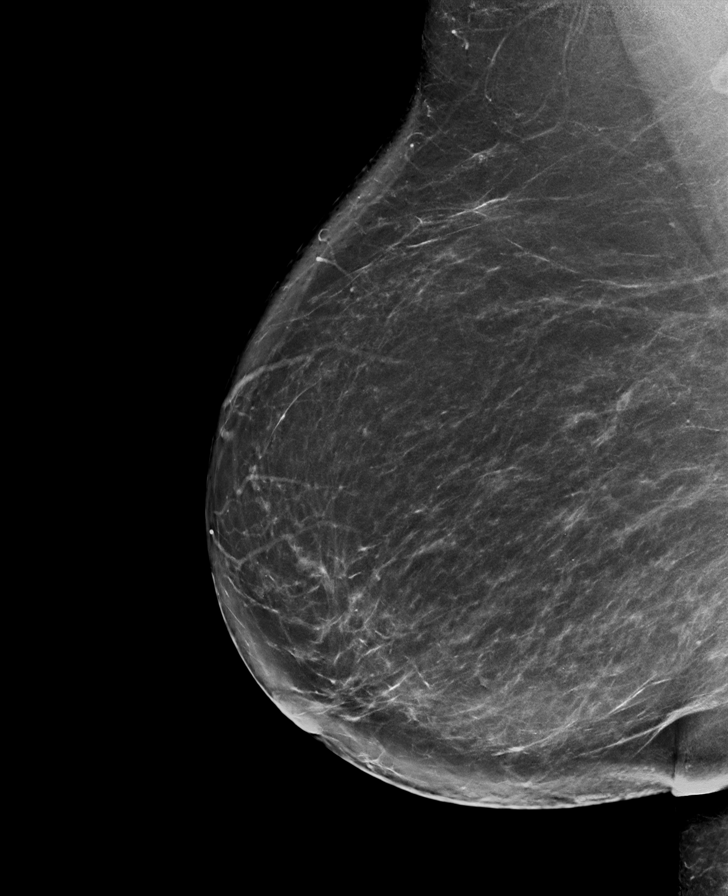

[L CC synth-2D (3 of 3)]
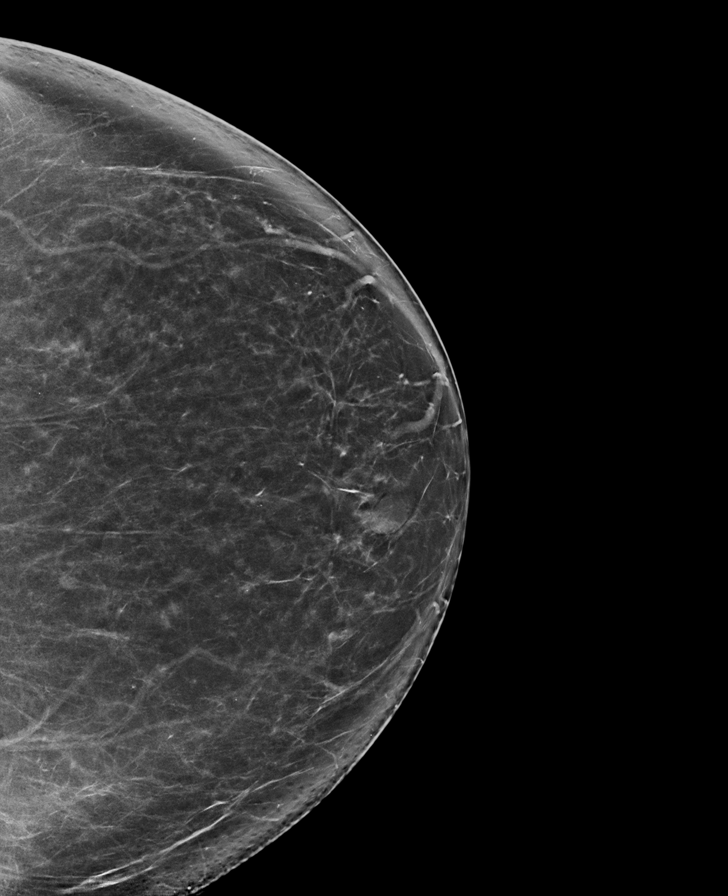

[R CC synth-2D (1 of 2)]
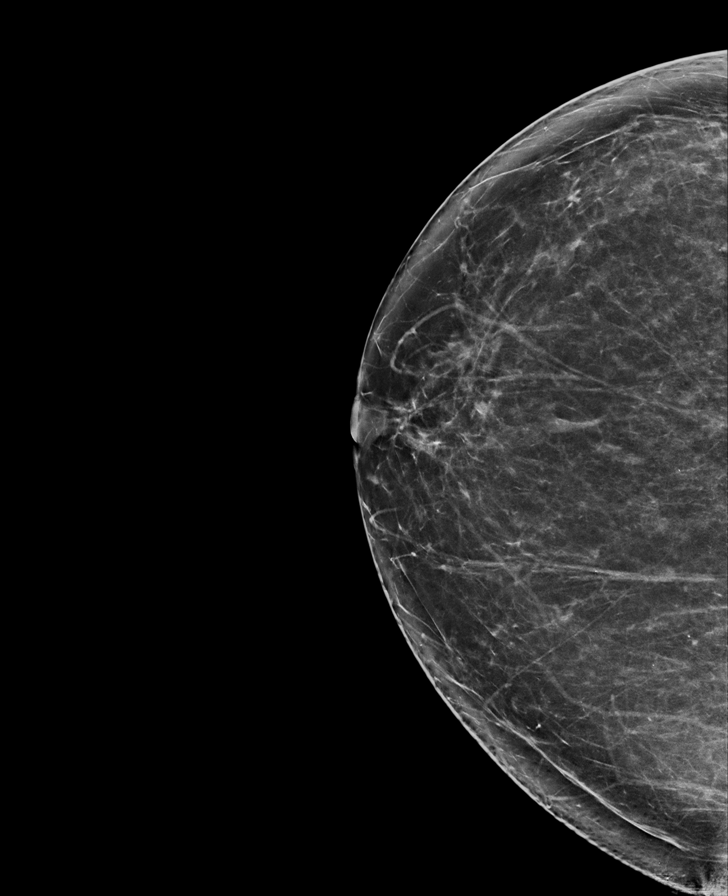

[R CC synth-2D (2 of 2)]
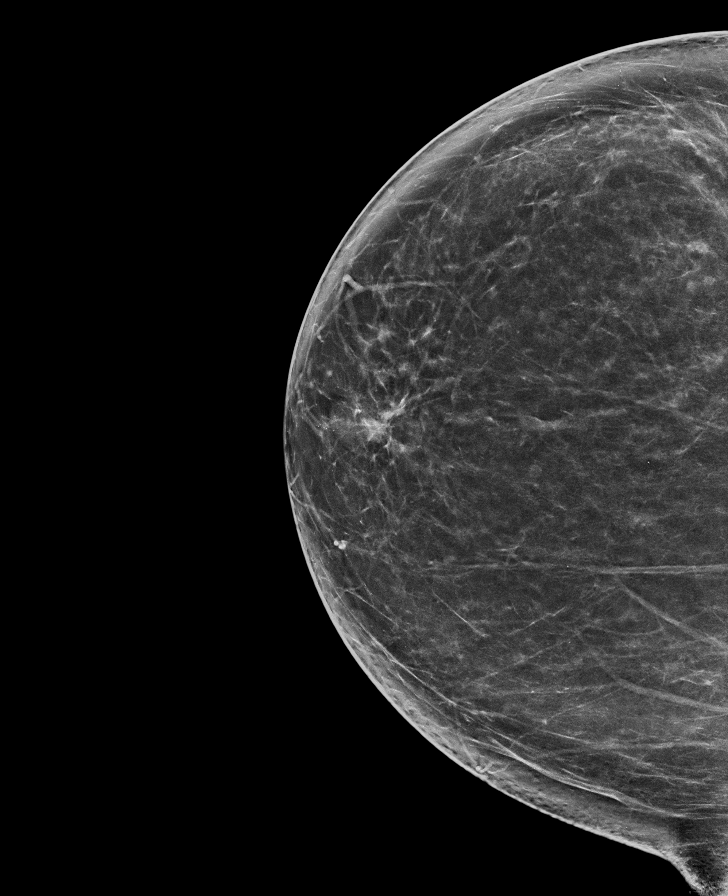

[L CC tomo · tomo slice 48/95.0]
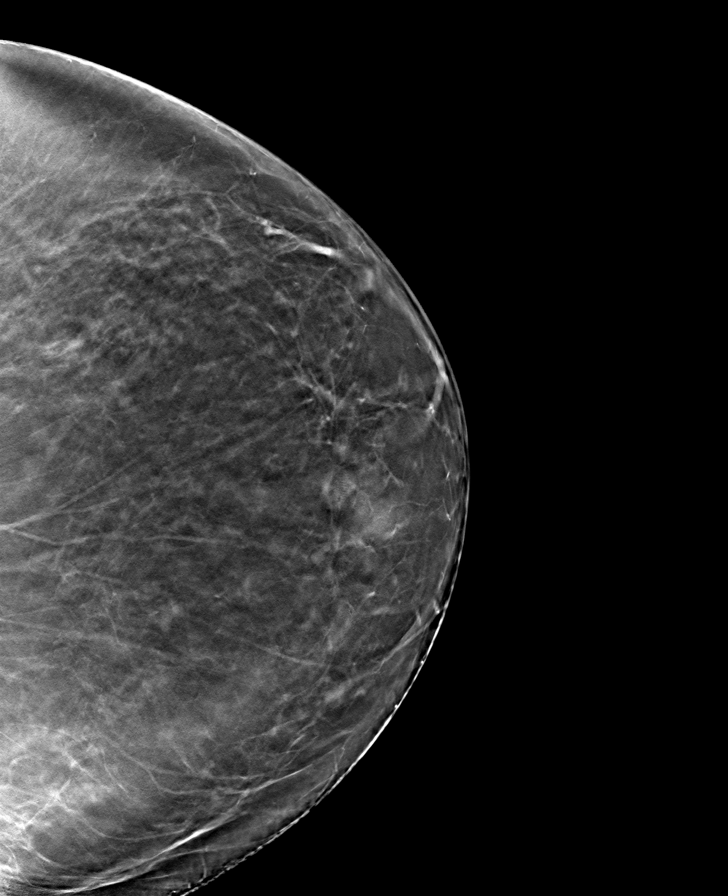

[8 of 40 positions shown; findings below may reference images not displayed]

ACR Breast Density Category b: There are scattered areas of
fibroglandular density.
FINDINGS: There are no findings suspicious for malignancy.
IMPRESSION: No mammographic evidence of malignancy. A result letter of this
screening mammogram will be mailed directly to the patient.

RECOMMENDATION:
Screening mammogram in one year. (Code:51-O-LD2)

BI-RADS CATEGORY  1: Negative.

## 2023-03-22 ENCOUNTER — Other Ambulatory Visit: Payer: Self-pay | Admitting: Internal Medicine

## 2023-03-22 DIAGNOSIS — Z1231 Encounter for screening mammogram for malignant neoplasm of breast: Secondary | ICD-10-CM

## 2023-04-27 ENCOUNTER — Ambulatory Visit
Admission: RE | Admit: 2023-04-27 | Discharge: 2023-04-27 | Disposition: A | Discharge status: Home / Self Care | Payer: Medicare HMO | Source: Ambulatory Visit | Attending: Internal Medicine | Admitting: Internal Medicine

## 2023-04-27 DIAGNOSIS — Z1231 Encounter for screening mammogram for malignant neoplasm of breast: Secondary | ICD-10-CM | POA: Insufficient documentation

## 2023-09-26 ENCOUNTER — Encounter: Payer: Self-pay | Admitting: Gastroenterology

## 2023-10-19 ENCOUNTER — Ambulatory Visit: Admitting: General Practice

## 2023-10-19 ENCOUNTER — Encounter: Payer: Self-pay | Admitting: Gastroenterology

## 2023-10-19 ENCOUNTER — Encounter
Admission: RE | Disposition: A | Discharge status: Home / Self Care | Payer: Self-pay | Source: Home / Self Care | Attending: Gastroenterology

## 2023-10-19 ENCOUNTER — Other Ambulatory Visit: Payer: Self-pay

## 2023-10-19 ENCOUNTER — Ambulatory Visit
Admission: RE | Admit: 2023-10-19 | Discharge: 2023-10-19 | Disposition: A | Discharge status: Home / Self Care | Attending: Gastroenterology | Admitting: Gastroenterology

## 2023-10-19 DIAGNOSIS — Z83719 Family history of colon polyps, unspecified: Secondary | ICD-10-CM | POA: Insufficient documentation

## 2023-10-19 DIAGNOSIS — Z6841 Body Mass Index (BMI) 40.0 and over, adult: Secondary | ICD-10-CM | POA: Diagnosis not present

## 2023-10-19 DIAGNOSIS — D123 Benign neoplasm of transverse colon: Secondary | ICD-10-CM | POA: Diagnosis not present

## 2023-10-19 DIAGNOSIS — E039 Hypothyroidism, unspecified: Secondary | ICD-10-CM | POA: Insufficient documentation

## 2023-10-19 DIAGNOSIS — Z800 Family history of malignant neoplasm of digestive organs: Secondary | ICD-10-CM | POA: Insufficient documentation

## 2023-10-19 DIAGNOSIS — E66813 Obesity, class 3: Secondary | ICD-10-CM | POA: Diagnosis not present

## 2023-10-19 DIAGNOSIS — Z1211 Encounter for screening for malignant neoplasm of colon: Secondary | ICD-10-CM | POA: Insufficient documentation

## 2023-10-19 DIAGNOSIS — I129 Hypertensive chronic kidney disease with stage 1 through stage 4 chronic kidney disease, or unspecified chronic kidney disease: Secondary | ICD-10-CM | POA: Insufficient documentation

## 2023-10-19 DIAGNOSIS — J45909 Unspecified asthma, uncomplicated: Secondary | ICD-10-CM | POA: Diagnosis not present

## 2023-10-19 DIAGNOSIS — N189 Chronic kidney disease, unspecified: Secondary | ICD-10-CM | POA: Insufficient documentation

## 2023-10-19 DIAGNOSIS — K640 First degree hemorrhoids: Secondary | ICD-10-CM | POA: Insufficient documentation

## 2023-10-19 DIAGNOSIS — Z90710 Acquired absence of both cervix and uterus: Secondary | ICD-10-CM | POA: Insufficient documentation

## 2023-10-19 HISTORY — DX: Hyperlipidemia, unspecified: E78.5

## 2023-10-19 HISTORY — PX: COLONOSCOPY: SHX5424

## 2023-10-19 HISTORY — DX: Chronic kidney disease, unspecified: N18.9

## 2023-10-19 SURGERY — COLONOSCOPY
Anesthesia: General

## 2023-10-19 MED ORDER — SODIUM CHLORIDE 0.9 % IV SOLN: INTRAVENOUS | Status: DC

## 2023-10-19 MED ORDER — EPHEDRINE 5 MG/ML INJ
INTRAVENOUS | Status: AC
  Filled 2023-10-19: qty 5

## 2023-10-19 MED ORDER — LIDOCAINE HCL (PF) 2 % IJ SOLN
INTRAMUSCULAR | Status: AC
Start: 2023-10-19 — End: 2023-10-19
  Filled 2023-10-19: qty 5

## 2023-10-19 MED ORDER — LIDOCAINE HCL (CARDIAC) PF 100 MG/5ML IV SOSY
PREFILLED_SYRINGE | INTRAVENOUS | Status: DC | PRN
  Administered 2023-10-19: 80 mg via INTRAVENOUS

## 2023-10-19 MED ORDER — PROPOFOL 10 MG/ML IV BOLUS
INTRAVENOUS | Status: DC | PRN
  Administered 2023-10-19 (×2): 50 mg via INTRAVENOUS

## 2023-10-19 MED ORDER — DEXMEDETOMIDINE HCL IN NACL 80 MCG/20ML IV SOLN
INTRAVENOUS | Status: DC | PRN
  Administered 2023-10-19: 20 ug via INTRAVENOUS

## 2023-10-19 MED ORDER — PROPOFOL 500 MG/50ML IV EMUL
INTRAVENOUS | Status: DC | PRN
  Administered 2023-10-19: 50 ug/kg/min via INTRAVENOUS

## 2023-10-19 NOTE — Transfer of Care (Signed)
 Immediate Anesthesia Transfer of Care Note  Patient: Vanessa Gray  Procedure(s) Performed: COLONOSCOPY  Patient Location: PACU  Anesthesia Type:General  Level of Consciousness: sedated  Airway & Oxygen Therapy: Patient Spontanous Breathing  Post-op Assessment: Report given to RN and Post -op Vital signs reviewed and stable  Post vital signs: Reviewed and stable  Last Vitals:  Vitals Value Taken Time  BP 99/53 10/19/23 14:00  Temp 35.6 C 10/19/23 13:59  Pulse 72 10/19/23 14:01  Resp 24 10/19/23 14:02  SpO2 100 % 10/19/23 14:01  Vitals shown include unfiled device data.  Last Pain:  Vitals:   10/19/23 1359  TempSrc: Temporal  PainSc: 0-No pain         Complications: No notable events documented.

## 2023-10-19 NOTE — Interval H&P Note (Signed)
 History and Physical Interval Note: Preprocedure H&P from 10/19/23  was reviewed and there was no interval change after seeing and examining the patient.  Written consent was obtained from the patient after discussion of risks, benefits, and alternatives. Patient has consented to proceed with Colonoscopy with possible intervention   10/19/2023 1:28 PM  Vanessa Gray  has presented today for surgery, with the diagnosis of History of adenomatous polyp of colon (Z86.0101).  The various methods of treatment have been discussed with the patient and family. After consideration of risks, benefits and other options for treatment, the patient has consented to  Procedure(s): COLONOSCOPY (N/A) as a surgical intervention.  The patient's history has been reviewed, patient examined, no change in status, stable for surgery.  I have reviewed the patient's chart and labs.  Questions were answered to the patient's satisfaction.     Elspeth Ozell Jungling

## 2023-10-19 NOTE — Anesthesia Postprocedure Evaluation (Signed)
 Anesthesia Post Note  Patient: Vanessa Gray  Procedure(s) Performed: COLONOSCOPY  Patient location during evaluation: Endoscopy Anesthesia Type: General Level of consciousness: awake and alert Pain management: pain level controlled Vital Signs Assessment: post-procedure vital signs reviewed and stable Respiratory status: spontaneous breathing, nonlabored ventilation, respiratory function stable and patient connected to nasal cannula oxygen Cardiovascular status: blood pressure returned to baseline and stable Postop Assessment: no apparent nausea or vomiting Anesthetic complications: no   No notable events documented.   Last Vitals:  Vitals:   10/19/23 1359 10/19/23 1409  BP: (!) 99/53 128/74  Pulse: 73 74  Resp: 15 14  Temp: (!) 35.6 C   SpO2: 98% 100%    Last Pain:  Vitals:   10/19/23 1409  TempSrc:   PainSc: 0-No pain                 Debby Mines

## 2023-10-19 NOTE — Op Note (Signed)
 [facility] Gastroenterology Patient Name: Vanessa Gray Procedure Date: 10/19/2023 1:20 PM MRN: [id number] Account #: [id number] Date of Birth: 1956/04/02 Admit Type: Outpatient Age: 68 Room: [facility] ENDO ROOM 1 Gender: Female Note Status: Finalized Instrument Name: Colonoscope [id number] Procedure:             Colonoscopy Indications:           High risk colon cancer surveillance: Personal history                         of colonic polyps, Family history of colon cancer Providers:             Elspeth Ozell Onita ROSALEA, DO Referring MD:          Elspeth Ozell Onita DO, DO (Referring MD) Medicines:             Monitored Anesthesia Care Complications:         No immediate complications. Estimated blood loss:                         Minimal. Procedure:             Pre-Anesthesia Assessment:                        - Prior to the procedure, a History and Physical was                         performed, and patient medications and allergies were                         reviewed. The patient is competent. The risks and                         benefits of the procedure and the sedation options and                         risks were discussed with the patient. All questions                         were answered and informed consent was obtained.                         Patient identification and proposed procedure were                         verified by the physician, the nurse, the anesthetist                         and the technician in the endoscopy suite. Mental                         Status Examination: alert and oriented. Airway                         Examination: normal oropharyngeal airway and neck                         mobility. Respiratory Examination: clear to  auscultation. CV Examination: RRR, no murmurs, no S3                         or S4. Prophylactic Antibiotics: The patient does not                         require prophylactic  antibiotics. Prior                         Anticoagulants: The patient has taken no anticoagulant                         or antiplatelet agents. ASA Grade Assessment: III - A                         patient with severe systemic disease. After reviewing                         the risks and benefits, the patient was deemed in                         satisfactory condition to undergo the procedure. The                         anesthesia plan was to use monitored anesthesia care                         (MAC). Immediately prior to administration of                         medications, the patient was re-assessed for adequacy                         to receive sedatives. The heart rate, respiratory                         rate, oxygen saturations, blood pressure, adequacy of                         pulmonary ventilation, and response to care were                         monitored throughout the procedure. The physical                         status of the patient was re-assessed after the                         procedure.                        After obtaining informed consent, the colonoscope was                         passed under direct vision. Throughout the procedure,                         the patient's blood pressure, pulse, and oxygen  saturations were monitored continuously. The                         Colonoscope was introduced through the anus and                         advanced to the the cecum, identified by appendiceal                         orifice and ileocecal valve. The colonoscopy was                         performed without difficulty. The patient tolerated                         the procedure well. The quality of the bowel                         preparation was evaluated using the BBPS [facility] Bowel                         Preparation Scale) with scores of: Right Colon = 2                         (minor amount of residual staining, small  fragments of                         stool and/or opaque liquid, but mucosa seen well),                         Transverse Colon = 3 (entire mucosa seen well with no                         residual staining, small fragments of stool or opaque                         liquid) and Left Colon = 2 (minor amount of residual                         staining, small fragments of stool and/or opaque                         liquid, but mucosa seen well). The total BBPS score                         equals 7. The quality of the bowel preparation was                         good. The ileocecal valve, appendiceal orifice, and                         rectum were photographed. Findings:      The perianal and digital rectal examinations were normal. Pertinent       negatives include normal sphincter tone.      A 1 to 2 mm polyp was found in the transverse colon. The polyp was       sessile. The  polyp was removed with a jumbo cold forceps. Resection and       retrieval were complete. Estimated blood loss was minimal.      Non-bleeding internal hemorrhoids were found during retroflexion. The       hemorrhoids were Grade I (internal hemorrhoids that do not prolapse).       Estimated blood loss: none.      The exam was otherwise without abnormality on direct and retroflexion       views. Impression:            - One 1 to 2 mm polyp in the transverse colon, removed                         with a jumbo cold forceps. Resected and retrieved.                        - Non-bleeding internal hemorrhoids.                        - The examination was otherwise normal on direct and                         retroflexion views. Recommendation:        - Patient has a contact number available for                         emergencies. The signs and symptoms of potential                         delayed complications were discussed with the patient.                         Return to normal activities tomorrow. Written                          discharge instructions were provided to the patient.                        - Discharge patient to home.                        - Resume previous diet.                        - Continue present medications.                        - Await pathology results.                        - Repeat colonoscopy in 5 years for surveillance based                         on pathology results.                        - Return to referring physician as previously                         scheduled.                        -  The findings and recommendations were discussed with                         the patient. Procedure Code(s):     --- Professional ---                        [phone], Colonoscopy, flexible; with biopsy, single or                         multiple Diagnosis Code(s):     --- Professional ---                        Z86.010, Personal history of colonic polyps                        D12.3, Benign neoplasm of transverse colon (hepatic                         flexure or splenic flexure)                        K64.0, First degree hemorrhoids                        Z80.0, Family history of malignant neoplasm of                         digestive organs CPT copyright 2022 American [employer] Association. All rights reserved. The codes documented in this report are preliminary and upon coder review may  be revised to meet current compliance requirements. Attending Participation:      I personally performed the entire procedure. Elspeth Jungling, DO Elspeth Ozell Jungling DO, DO 10/19/2023 2:01:51 PM This report has been signed electronically. Number of Addenda: 0 Note Initiated On: 10/19/2023 1:20 PM Scope Withdrawal Time: 0 hours 11 minutes 30 seconds  Total Procedure Duration: 0 hours 17 minutes 34 seconds  Estimated Blood Loss:  Estimated blood loss was minimal.      [facility]

## 2023-10-19 NOTE — Anesthesia Preprocedure Evaluation (Signed)
 Anesthesia Evaluation  Patient identified by MRN, date of birth, ID band Patient awake    Reviewed: Allergy & Precautions, H&P , NPO status , Patient's Chart, lab work & pertinent test results  Airway Mallampati: III  TM Distance: >3 FB     Dental  (+) Teeth Intact   Pulmonary asthma , sleep apnea (suspected; snores at night although says this has improved since she lost a significant amount of weight)           Cardiovascular hypertension,      Neuro/Psych negative neurological ROS  negative psych ROS   GI/Hepatic negative GI ROS, Neg liver ROS,,,  Endo/Other  Hypothyroidism  Class 3 obesity  Renal/GU      Musculoskeletal   Abdominal   Peds  Hematology negative hematology ROS (+)   Anesthesia Other Findings Past Medical History: No date: Allergic rhinitis No date: Fibroid tumor     Comment:  Uterine No date: Hypertension No date: Hypothyroidism No date: Thyroid disease No date: Vitamin D deficiency  Past Surgical History: 2004: ABDOMINAL HYSTERECTOMY No date: BREAST SURGERY 2013: COLONOSCOPY  BMI    Body Mass Index:  40.97 kg/m      Reproductive/Obstetrics negative OB ROS                              Anesthesia Physical Anesthesia Plan  ASA: 3  Anesthesia Plan: General   Post-op Pain Management: Minimal or no pain anticipated   Induction: Intravenous  PONV Risk Score and Plan: 3 and Propofol  infusion, TIVA and Ondansetron   Airway Management Planned: Nasal Cannula  Additional Equipment: None  Intra-op Plan:   Post-operative Plan:   Informed Consent: I have reviewed the patients History and Physical, chart, labs and discussed the procedure including the risks, benefits and alternatives for the proposed anesthesia with the patient or authorized representative who has indicated his/her understanding and acceptance.     Dental advisory given  Plan Discussed  with: CRNA and Surgeon  Anesthesia Plan Comments: (Discussed risks of anesthesia with patient, including possibility of difficulty with spontaneous ventilation under anesthesia necessitating airway intervention, PONV, and rare risks such as cardiac or respiratory or neurological events, and allergic reactions. Discussed the role of CRNA in patient's perioperative care. Patient understands.)        Anesthesia Quick Evaluation

## 2023-10-19 NOTE — H&P (Signed)
 Pre-Procedure H&P   Patient ID: Vanessa Gray is a 68 y.o. female.  Gastroenterology Provider: Elspeth Ozell Jungling, DO  Referring Provider: Dr. Sadie PCP: Sadie Manna, MD  Date: 10/19/2023  HPI Ms. Vanessa Gray is a 68 y.o. female who presents today for Colonoscopy for Personal history of colon polyps; family history of colon cancer and colon polyp .  Reporting daily to every other day bowel movement.  No melena or hematochezia  Family history of colon cancer and colon polyps in her mother  Last underwent colonoscopy in 2020 with 1 adenomatous polyp.  Negative colonoscopies in 2014 and 2009  Status post hysterectomy   Past Medical History:  Diagnosis Date   Allergic rhinitis    Chronic kidney disease    Fibroid tumor    Uterine   Hyperlipidemia    Hypertension    Hypothyroidism    Thyroid disease    Vitamin D deficiency     Past Surgical History:  Procedure Laterality Date   ABDOMINAL HYSTERECTOMY  2004   COLONOSCOPY  2013   COLONOSCOPY WITH PROPOFOL  N/A 06/08/2018   Procedure: COLONOSCOPY WITH PROPOFOL ;  Surgeon: Gaylyn Gladis PENNER, MD;  Location: [facility];  Service: Endoscopy;  Laterality: N/A;   excision skin cyst head Left    FINE NEEDLE ASPIRATION Right    breast   LIPOMA EXCISION Right    neck and scalp    Family History Mother-colon polyps and colon cancer No other h/o GI disease or malignancy  Review of Systems  Constitutional:  Negative for activity change, appetite change, chills, diaphoresis, fatigue, fever and unexpected weight change.  HENT:  Negative for trouble swallowing and voice change.   Respiratory:  Negative for shortness of breath and wheezing.   Cardiovascular:  Negative for chest pain, palpitations and leg swelling.  Gastrointestinal:  Negative for abdominal distention, abdominal pain, anal bleeding, blood in stool, constipation, diarrhea, nausea, rectal pain and vomiting.  Musculoskeletal:  Negative for  arthralgias and myalgias.  Skin:  Negative for color change and pallor.  Neurological:  Negative for dizziness, syncope and weakness.  Psychiatric/Behavioral:  Negative for confusion.   All other systems reviewed and are negative.    Medications No current facility-administered medications on file prior to encounter.   Current Outpatient Medications on File Prior to Encounter  Medication Sig Dispense Refill   aspirin 81 MG tablet Take 81 mg by mouth daily.     atorvastatin (LIPITOR) 10 MG tablet Take 10 mg by mouth daily.     Cyanocobalamin (VITAMIN B 12 PO) Take 500 Int'l Units by mouth daily.     fish oil-omega-3 fatty acids 1000 MG capsule Take 1 g by mouth daily.     levothyroxine (SYNTHROID, LEVOTHROID) 100 MCG tablet Take 100 mcg by mouth daily before breakfast.     lisinopril (PRINIVIL,ZESTRIL) 20 MG tablet Take 20 mg by mouth daily.     potassium chloride SA (K-DUR,KLOR-CON) 20 MEQ tablet Take 20 mEq by mouth daily.     Vitamin D, Ergocalciferol, (DRISDOL) 50000 UNITS CAPS capsule Take 50,000 Units by mouth every 7 (seven) days.     Albuterol Sulfate 108 (90 Base) MCG/ACT AEPB Inhale into the lungs.     hydrochlorothiazide (HYDRODIURIL) 25 MG tablet Take 25 mg by mouth daily.     metroNIDAZOLE  (FLAGYL ) 500 MG tablet Take 1 tablet (500 mg total) by mouth 2 (two) times daily after a meal. 14 tablet 0   ondansetron  (ZOFRAN  ODT) 4 MG disintegrating tablet  Take 1 tablet (4 mg total) by mouth every 8 (eight) hours as needed. 20 tablet 0    Pertinent medications related to GI and procedure were reviewed by me with the patient prior to the procedure   Current Facility-Administered Medications:    0.9 %  sodium chloride  infusion, , Intravenous, Continuous, Onita Elspeth Sharper, DO, Last Rate: 20 mL/hr at 10/19/23 1251, Continued from Pre-op at 10/19/23 1251  sodium chloride  20 mL/hr at 10/19/23 1251       No Known Allergies Allergies were reviewed by me prior to the  procedure  Objective   Body mass index is 43.07 kg/m. Vitals:   10/19/23 1156  BP: (!) 180/66  Pulse: 89  Resp: 13  Temp: (!) 97.2 F (36.2 C)  TempSrc: Temporal  SpO2: 100%  Weight: 117.4 kg  Height: 5' 5 (1.651 m)     Physical Exam Vitals and nursing note reviewed.  Constitutional:      General: She is not in acute distress.    Appearance: Normal appearance. She is obese. She is not ill-appearing, toxic-appearing or diaphoretic.  HENT:     Head: Normocephalic and atraumatic.     Nose: Nose normal.     Mouth/Throat:     Mouth: Mucous membranes are moist.     Pharynx: Oropharynx is clear.  Eyes:     General: No scleral icterus.    Extraocular Movements: Extraocular movements intact.  Cardiovascular:     Rate and Rhythm: Normal rate and regular rhythm.     Heart sounds: Normal heart sounds. No murmur heard.    No friction rub. No gallop.  Pulmonary:     Effort: Pulmonary effort is normal. No respiratory distress.     Breath sounds: Normal breath sounds. No wheezing, rhonchi or rales.  Abdominal:     General: Bowel sounds are normal. There is no distension.     Palpations: Abdomen is soft.     Tenderness: There is no abdominal tenderness. There is no guarding or rebound.  Musculoskeletal:     Cervical back: Neck supple.     Right lower leg: No edema.     Left lower leg: No edema.  Skin:    General: Skin is warm and dry.     Coloration: Skin is not jaundiced or pale.  Neurological:     General: No focal deficit present.     Mental Status: She is alert and oriented to person, place, and time. Mental status is at baseline.  Psychiatric:        Mood and Affect: Mood normal.        Behavior: Behavior normal.        Thought Content: Thought content normal.        Judgment: Judgment normal.      Assessment:  Ms. Vanessa Gray is a 68 y.o. female  who presents today for Colonoscopy for Personal history of colon polyps; family history of colon cancer and  colon polyp .  Plan:  Colonoscopy with possible intervention today  Colonoscopy with possible biopsy, control of bleeding, polypectomy, and interventions as necessary has been discussed with the patient/patient representative. Informed consent was obtained from the patient/patient representative after explaining the indication, nature, and risks of the procedure including but not limited to death, bleeding, perforation, missed neoplasm/lesions, cardiorespiratory compromise, and reaction to medications. Opportunity for questions was given and appropriate answers were provided. Patient/patient representative has verbalized understanding is amenable to undergoing the procedure.   Elspeth Sharper Onita,  DO  [facility]  Portions of the record may have been created with voice recognition software. Occasional wrong-word or 'sound-a-like' substitutions may have occurred due to the inherent limitations of voice recognition software.  Read the chart carefully and recognize, using context, where substitutions may have occurred.

## 2023-10-20 ENCOUNTER — Encounter: Payer: Self-pay | Admitting: Gastroenterology

## 2023-10-20 LAB — SURGICAL PATHOLOGY

## 2024-03-19 ENCOUNTER — Other Ambulatory Visit: Payer: Self-pay | Admitting: Internal Medicine

## 2024-03-19 DIAGNOSIS — Z1231 Encounter for screening mammogram for malignant neoplasm of breast: Secondary | ICD-10-CM

## 2024-04-29 ENCOUNTER — Ambulatory Visit

## 2024-05-22 ENCOUNTER — Ambulatory Visit
